# Patient Record
Sex: Female | Born: 1965 | Race: White | Hispanic: No | Marital: Married | Smoking: Never smoker
Health system: Southern US, Community
[De-identification: ages and names within clinical notes are randomized; demographics above are authoritative.]

## PROBLEM LIST (undated history)

## (undated) DIAGNOSIS — K3189 Other diseases of stomach and duodenum: Secondary | ICD-10-CM

## (undated) DIAGNOSIS — K219 Gastro-esophageal reflux disease without esophagitis: Secondary | ICD-10-CM

## (undated) DIAGNOSIS — M1990 Unspecified osteoarthritis, unspecified site: Secondary | ICD-10-CM

## (undated) DIAGNOSIS — C50919 Malignant neoplasm of unspecified site of unspecified female breast: Secondary | ICD-10-CM

## (undated) DIAGNOSIS — K648 Other hemorrhoids: Secondary | ICD-10-CM

## (undated) DIAGNOSIS — K635 Polyp of colon: Secondary | ICD-10-CM

## (undated) DIAGNOSIS — E785 Hyperlipidemia, unspecified: Secondary | ICD-10-CM

## (undated) DIAGNOSIS — T7840XA Allergy, unspecified, initial encounter: Secondary | ICD-10-CM

## (undated) HISTORY — DX: Hyperlipidemia, unspecified: E78.5

## (undated) HISTORY — DX: Allergy, unspecified, initial encounter: T78.40XA

## (undated) HISTORY — DX: Polyp of colon: K63.5

## (undated) HISTORY — PX: COLONOSCOPY: SHX174

## (undated) HISTORY — PX: BREAST LUMPECTOMY: SHX2

## (undated) HISTORY — PX: RECTAL POLYPECTOMY: SHX2309

## (undated) HISTORY — PX: ABDOMINAL HYSTERECTOMY: SHX81

## (undated) HISTORY — DX: Other hemorrhoids: K64.8

## (undated) HISTORY — DX: Gastro-esophageal reflux disease without esophagitis: K21.9

## (undated) HISTORY — PX: OTHER SURGICAL HISTORY: SHX169

---

## 2012-03-15 ENCOUNTER — Emergency Department (HOSPITAL_COMMUNITY)
Admission: EM | Admit: 2012-03-15 | Discharge: 2012-03-15 | Disposition: A | Discharge status: Home / Self Care | Payer: BC Managed Care – PPO | Attending: Emergency Medicine | Admitting: Emergency Medicine

## 2012-03-15 ENCOUNTER — Encounter (HOSPITAL_COMMUNITY): Payer: Self-pay

## 2012-03-15 DIAGNOSIS — R55 Syncope and collapse: Secondary | ICD-10-CM | POA: Insufficient documentation

## 2012-03-15 DIAGNOSIS — N390 Urinary tract infection, site not specified: Secondary | ICD-10-CM | POA: Insufficient documentation

## 2012-03-15 HISTORY — DX: Malignant neoplasm of unspecified site of unspecified female breast: C50.919

## 2012-03-15 HISTORY — DX: Unspecified osteoarthritis, unspecified site: M19.90

## 2012-03-15 HISTORY — DX: Other diseases of stomach and duodenum: K31.89

## 2012-03-15 LAB — URINALYSIS, ROUTINE W REFLEX MICROSCOPIC
Bilirubin Urine: NEGATIVE
Glucose, UA: NEGATIVE mg/dL
Hgb urine dipstick: NEGATIVE
Ketones, ur: NEGATIVE mg/dL
Nitrite: NEGATIVE
Specific Gravity, Urine: 1.025 (ref 1.005–1.030)
pH: 6 (ref 5.0–8.0)

## 2012-03-15 LAB — BASIC METABOLIC PANEL
BUN: 18 mg/dL (ref 6–23)
CO2: 26 mEq/L (ref 19–32)
Chloride: 99 mEq/L (ref 96–112)
Creatinine, Ser: 0.61 mg/dL (ref 0.50–1.10)
GFR calc Af Amer: >90 mL/min (ref >90)
Glucose, Bld: 105 mg/dL — ABNORMAL HIGH (ref 70–99)
Potassium: 3.7 mEq/L (ref 3.5–5.1)

## 2012-03-15 LAB — CBC WITH DIFFERENTIAL/PLATELET
Basophils Relative: 0 % (ref 0–1)
HCT: 37.9 % (ref 36.0–46.0)
Hemoglobin: 12.7 g/dL (ref 12.0–15.0)
Lymphs Abs: 1.3 10*3/uL (ref 0.7–4.0)
MCH: 30.5 pg (ref 26.0–34.0)
MCHC: 33.5 g/dL (ref 30.0–36.0)
Monocytes Absolute: 0.5 10*3/uL (ref 0.1–1.0)
Monocytes Relative: 5 % (ref 3–12)
Neutro Abs: 8.3 10*3/uL — ABNORMAL HIGH (ref 1.7–7.7)
Neutrophils Relative %: 82 % — ABNORMAL HIGH (ref 43–77)
RBC: 4.16 MIL/uL (ref 3.87–5.11)

## 2012-03-15 LAB — URINE MICROSCOPIC-ADD ON

## 2012-03-15 LAB — TROPONIN I: Troponin I: <0.3 ng/mL (ref <0.30)

## 2012-03-15 MED ORDER — ONDANSETRON HCL 4 MG/2ML IJ SOLN
4.0000 mg | Freq: Once | INTRAMUSCULAR | Status: AC
  Administered 2012-03-15: 4 mg via INTRAVENOUS
  Filled 2012-03-15: qty 2

## 2012-03-15 MED ORDER — SODIUM CHLORIDE 0.9 % IV BOLUS (SEPSIS)
1000.0000 mL | Freq: Once | INTRAVENOUS | Status: AC
  Administered 2012-03-15: 1000 mL via INTRAVENOUS

## 2012-03-15 MED ORDER — DEXTROSE 5 % IV SOLN
1.0000 g | Freq: Once | INTRAVENOUS | Status: AC
  Administered 2012-03-15: 1 g via INTRAVENOUS
  Filled 2012-03-15: qty 10

## 2012-03-15 MED ORDER — HEPARIN SOD (PORK) LOCK FLUSH 100 UNIT/ML IV SOLN
INTRAVENOUS | Status: AC
  Administered 2012-03-15: 500 [IU]
  Filled 2012-03-15: qty 5

## 2012-03-15 MED ORDER — NITROFURANTOIN MONOHYD MACRO 100 MG PO CAPS: 100.0000 mg | ORAL_CAPSULE | Freq: Two times a day (BID) | ORAL | Status: DC

## 2012-03-15 NOTE — ED Notes (Signed)
Implanted port in left chest accessed using sterile technique. Blood obtained and NS infusing without difficulty on IV pump. Patient tolerated procedure well.

## 2012-03-15 NOTE — ED Notes (Signed)
Pt was eating lunch today, while sitting at table had what she describes "feeling of getting versed", "went completely out", assisted to floor by family member, thinks was out for approx 15 seconds, was in cont. Of urine and stool. Pt able to recall all information. Has vomited x2 since incident.

## 2012-03-15 NOTE — ED Provider Notes (Signed)
History  This chart was scribed for Carolyn Hutching, MD by Erskine Emery. This patient was seen in room APA07/APA07 and the patient's care was started at 15:53.   CSN: [id number]  Arrival date & time 03/15/12  1545   First MD Initiated Contact with Patient 03/15/12 1553      Chief Complaint  Patient presents with  . Loss of Consciousness    (Consider location/radiation/quality/duration/timing/severity/associated sxs/prior treatment) The history is provided by the patient, the spouse and a friend. No language interpreter was used.  Carolyn Porter is a 46 y.o. female brought in by ambulance, who presents to the Emergency Department complaining of sudden onset syncope (15 seconds) with associated bowel and bladder incontinence and emesis this afternoon. Pt reports she went to church this morning then went to lunch, ate a sandwich and drank some tea, then just blacked out with a feeling described as "like being put under anesthesia." Pt's friend reports her blood sugar was 160 and her blood pressure was 110/68 just after the incident. Pt states she feels fine now other than a little fatigue and she denies any chest pain, SOB, or any other pains. Pt has no current health problems but she was diagnosed with breast cancer in 2001 and now is in remission after surgery and chemo therapy. Pt also reports a h/o migraines (mostly during the chemo) that she hasn't had in a while until this week, and some arthritis. Pt had a recent colonoscopy and rectal polyp removal after issues with anal bleeding. Pt reports she sometimes still sees streaks of blood upon excretion but never has black stools. Pt also has gastric irritation for which she takes Prilosec. Pt had episodes of syncope when she was a teenager but not since. 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   Past Medical History  Diagnosis Date  . Breast cancer   . Gastric irritation   . Arthritis     Past Surgical History  Procedure Date  . Rectal  polypectomy   . Colonoscopy   . Abdominal hysterectomy     No family history on file.  History  Substance Use Topics  . Smoking status: Never Smoker   . Smokeless tobacco: Not on file  . Alcohol Use: Yes     occ    OB History    Grav Para Term Preterm Abortions TAB SAB Ect Mult Living                  Review of Systems A complete 10 system review of systems was obtained and all systems are negative except as noted in the HPI and PMH.    Allergies  Erythromycin  Home Medications   Current Outpatient Rx  Name Route Sig Dispense Refill  . CALCIUM CARBONATE-VITAMIN D 600-200 MG-UNIT PO TABS Oral Take 1 tablet by mouth daily.    Marland Kitchen ONE-DAILY MULTI VITAMINS PO TABS Oral Take 1 tablet by mouth daily.    Marland Kitchen OMEPRAZOLE 20 MG PO CPDR Oral Take 20 mg by mouth daily.      Triage Vitals: BP 109/57  Pulse 76  Temp 97.6 F (36.4 C) (Oral)  Resp 20  Ht 5\' 10"  (1.778 m)  Wt 150 lb (68.04 kg)  BMI 21.52 kg/m2  SpO2 100%  Physical Exam  Nursing note and vitals reviewed. Constitutional: She is oriented to person, place, and time. She appears well-developed and well-nourished.  HENT:  Head: Normocephalic and atraumatic.  Eyes: Conjunctivae normal and EOM are  normal. Pupils are equal, round, and reactive to light.  Neck: Normal range of motion. Neck supple.  Cardiovascular: Normal rate, regular rhythm and normal heart sounds.   Pulmonary/Chest: Effort normal and breath sounds normal.  Abdominal: Soft. Bowel sounds are normal.  Musculoskeletal: Normal range of motion.  Neurological: She is alert and oriented to person, place, and time.  Skin: Skin is warm and dry.  Psychiatric: She has a normal mood and affect.    ED Course  Procedures (including critical care time) DIAGNOSTIC STUDIES: Oxygen Saturation is 100% on room air, normal by my interpretation.    COORDINATION OF CARE: 16:26--I evaluated the patient and we discussed a treatment plan including blood work, EKG, and  IV fluids to which the pt agreed. I told the pt that we will run some tests but there is a good chance we will not be able to figure out why she passed out. I informed the pt that the most common reasons for syncope are sudden blood pressure changes or arrythmia and she doesn't show signs of this. I encouraged the pt to follow up with a cardiologist.   Labs Reviewed - No data to display No results found.   No diagnosis found.    Date: 03/15/2012  Rate: 67  Rhythm: normal sinus rhythm  QRS Axis: normal  Intervals: normal  ST/T Wave abnormalities: nonspecific ST changes  Conduction Disutrbances:none  Narrative Interpretation:   Old EKG Reviewed: none available Early repol   MDM  Patient looks well. Blood pressure slightly low. EKG normal. Hemoglobin normal. Urinalysis shows evidence of infection. Patient feels much better after 1 L of fluids. IV Rocephin given.   discharge home on Macrobid      I personally performed the services described in this documentation, which was scribed in my presence. The recorded information has been reviewed and considered.    Carolyn Hutching, MD 03/15/12 [phone]

## 2012-03-17 LAB — URINE CULTURE

## 2014-11-24 ENCOUNTER — Encounter: Payer: Self-pay | Admitting: Internal Medicine

## 2015-01-25 ENCOUNTER — Ambulatory Visit (INDEPENDENT_AMBULATORY_CARE_PROVIDER_SITE_OTHER): Payer: BLUE CROSS/BLUE SHIELD | Admitting: Internal Medicine

## 2015-01-25 ENCOUNTER — Other Ambulatory Visit (INDEPENDENT_AMBULATORY_CARE_PROVIDER_SITE_OTHER): Payer: BLUE CROSS/BLUE SHIELD

## 2015-01-25 ENCOUNTER — Encounter: Payer: Self-pay | Admitting: Internal Medicine

## 2015-01-25 VITALS — BP 110/68 | HR 68 | Ht 70.0 in | Wt 177.4 lb

## 2015-01-25 DIAGNOSIS — R1011 Right upper quadrant pain: Secondary | ICD-10-CM

## 2015-01-25 DIAGNOSIS — R197 Diarrhea, unspecified: Secondary | ICD-10-CM

## 2015-01-25 DIAGNOSIS — K219 Gastro-esophageal reflux disease without esophagitis: Secondary | ICD-10-CM | POA: Diagnosis not present

## 2015-01-25 LAB — IGA: IGA: 155 mg/dL (ref 68–378)

## 2015-01-25 MED ORDER — NA SULFATE-K SULFATE-MG SULF 17.5-3.13-1.6 GM/177ML PO SOLN: 1.0000 | Freq: Once | ORAL | Status: DC

## 2015-01-25 NOTE — Patient Instructions (Signed)
Your physician has requested that you go to the basement for the following lab work before leaving today:  TTG, IGA   You have been scheduled for a colonoscopy. Please follow written instructions given to you at your visit today.  Please pick up your prep supplies at the pharmacy within the next 1-3 days. If you use inhalers (even only as needed), please bring them with you on the day of your procedure.   You have been scheduled for an abdominal ultrasound at [facility] radiology on 02/03/2015 at 10:00am. Please arrive 15 minutes prior to your appointment for registration. Make certain not to have anything to eat or drink after midnight. Should you need to reschedule your appointment, please contact radiology at [phone]. This test typically takes about 30 minutes to perform.   As discussed with Dr. Henrene Pastor you can increase your Imodium and use it more regularly

## 2015-01-25 NOTE — Progress Notes (Signed)
HISTORY OF PRESENT ILLNESS:  Carolyn Porter is a 49 y.o. female who refers herself today upon recommendation from Carolyn Porter to establish as a new patient upon the retirement of her previous GI physician. Her chief complaint today is chronic diarrhea, abdominal discomfort, and GERD. First, patient reports having had problems with loose stools since 2001 after being treated with chemotherapy for breast cancer. The problem has worsened over the past year. She describes frequent watery stools with urgency that is explosive at times. She reports 5-15 bowel movements per day. The problem is exacerbated by meals. No nocturnal symptoms for the most part. More than half the time she will experience cramping abdominal pain which is relieved with defecation. There is minor intermittent bleeding. She is taking no nonprescription medications and does not use sugar substitutes. She has tried yogurt and probiotics without improvement. She is reluctant to use Imodium, but this helps. She has had no other therapies. She did undergo complete colonoscopy in [place] with Dr. Algis Porter 01/20/2012. A 1 cm proximal rectal polyp was removed and found to be inflammatory. No random colon biopsies. Upper endoscopy that same day was unremarkable. Normal duodenal biopsies. Mild gastritis. Patient has had GERD for about 4 years and has been on PPI with control of symptoms. No dysphagia. She is a Equities trader with experience in endoscopy. Her next complaint is that of right upper quadrant pain she has had for approximately one year. It occurs several times per week and lasts approximately 30 minutes. Exacerbated by large meals. No obvious relieving factors.  REVIEW OF SYSTEMS:  All non-GI ROS negative except for sinus and allergy trouble, back pain  Past Medical History  Diagnosis Date  . Breast cancer   . Gastric irritation   . Arthritis   . Internal hemorrhoid   . Colon polyp     Past Surgical History  Procedure  Laterality Date  . Rectal polypectomy    . Colonoscopy    . Abdominal hysterectomy    . Breast lumpectomy      Social History Carolyn Porter  reports that she has never smoked. She has never used smokeless tobacco. She reports that she drinks alcohol. She reports that she does not use illicit drugs.  family history is not on file.  Allergies  Allergen Reactions  . Erythromycin Anaphylaxis    Patient allergic to all of the mycin medications       PHYSICAL EXAMINATION: Vital signs: BP 110/68 mmHg  Pulse 68  Ht 5\' 10"  (1.778 m)  Wt 177 lb 6.4 oz (80.468 kg)  BMI 25.45 kg/m2  Constitutional: generally well-appearing, no acute distress Psychiatric: alert and oriented x3, cooperative Eyes: extraocular movements intact, anicteric, conjunctiva pink Mouth: oral pharynx moist, no lesions Neck: supple no lymphadenopathy Cardiovascular: heart regular rate and rhythm, no murmur Lungs: clear to auscultation bilaterally Abdomen: soft, nontender, nondistended, no obvious ascites, no peritoneal signs, normal bowel sounds, no organomegaly Rectal: Deferred until colonoscopy Extremities: no clubbing cyanosis or lower extremity edema bilaterally Skin: no lesions on visible extremities Neuro: No focal deficits. No asterixis.   ASSESSMENT:  #1. Chronic problems with loose stools, cramping, and urgency as described. Worse over the past year. Suspect irritable bowel syndrome. Rule out celiac sprue. Rule out microscopic colitis #2. Right upper quadrant pain of 1 year's duration. Rule out gallstones. Rule out musculoskeletal #3. Chronic GERD. Symptoms controlled with PPI at present #4. Remote history of breast cancer  PLAN:  #1. Celiac serologies #2. Colonoscopy with biopsies  to rule out microscopic colitis.The nature of the procedure, as well as the risks, benefits, and alternatives were carefully and thoroughly reviewed with the patient. Ample time for discussion and questions allowed. The  patient understood, was satisfied, and agreed to proceed. #3. Okay to increased frequency of Imodium as this helps #4. Abdominal ultrasound to evaluate right upper quadrant pain #5. Reflux precautions #6. Continue PPI. Refill as requested #7. Follow-up office visit after the above completed

## 2015-01-26 LAB — TISSUE TRANSGLUTAMINASE, IGA: Tissue Transglutaminase Ab, IgA: 1 U/mL (ref <4)

## 2015-02-03 ENCOUNTER — Ambulatory Visit (HOSPITAL_COMMUNITY)
Admission: RE | Admit: 2015-02-03 | Discharge: 2015-02-03 | Disposition: A | Discharge status: Home / Self Care | Payer: BLUE CROSS/BLUE SHIELD | Source: Ambulatory Visit | Attending: Internal Medicine | Admitting: Internal Medicine

## 2015-02-03 ENCOUNTER — Other Ambulatory Visit: Payer: Self-pay | Admitting: Internal Medicine

## 2015-02-03 DIAGNOSIS — R1011 Right upper quadrant pain: Secondary | ICD-10-CM | POA: Diagnosis not present

## 2015-02-03 DIAGNOSIS — K760 Fatty (change of) liver, not elsewhere classified: Secondary | ICD-10-CM | POA: Diagnosis not present

## 2015-02-03 DIAGNOSIS — R197 Diarrhea, unspecified: Secondary | ICD-10-CM

## 2015-02-03 DIAGNOSIS — K828 Other specified diseases of gallbladder: Secondary | ICD-10-CM | POA: Diagnosis not present

## 2015-03-31 ENCOUNTER — Encounter: Payer: Self-pay | Admitting: Internal Medicine

## 2015-03-31 ENCOUNTER — Ambulatory Visit (AMBULATORY_SURGERY_CENTER): Payer: BLUE CROSS/BLUE SHIELD | Admitting: Internal Medicine

## 2015-03-31 VITALS — BP 127/75 | HR 73 | Temp 98.7°F | Resp 21 | Ht 70.0 in | Wt 177.0 lb

## 2015-03-31 DIAGNOSIS — D125 Benign neoplasm of sigmoid colon: Secondary | ICD-10-CM | POA: Diagnosis present

## 2015-03-31 DIAGNOSIS — Z1211 Encounter for screening for malignant neoplasm of colon: Secondary | ICD-10-CM

## 2015-03-31 DIAGNOSIS — K5140 Inflammatory polyps of colon without complications: Secondary | ICD-10-CM | POA: Diagnosis not present

## 2015-03-31 DIAGNOSIS — R197 Diarrhea, unspecified: Secondary | ICD-10-CM | POA: Diagnosis not present

## 2015-03-31 MED ORDER — SODIUM CHLORIDE 0.9 % IV SOLN: 500.0000 mL | INTRAVENOUS | Status: DC

## 2015-03-31 MED ORDER — DEXLANSOPRAZOLE 60 MG PO CPDR: 60.0000 mg | DELAYED_RELEASE_CAPSULE | Freq: Every day | ORAL | Status: DC

## 2015-03-31 NOTE — Progress Notes (Signed)
Pt passed very little air while in recovery, pt c/o cramping, changed position and lowered head of bed, pt still unable to get rid of air, pt went to restroom and was able to pass a little more but still c/o some cramping, gave pt levsin 0.25mg  and once pt was dressed walked around recovery.pt returned to restroom and states she was able to pass more gas in there and feels better discomfort is 3/10 now. Pt states she will walk when she gets home and verbalizes complications to look for, pt discharged home.

## 2015-03-31 NOTE — Op Note (Signed)
[facility]  [address] [place], [id number]   COLONOSCOPY PROCEDURE REPORT  PATIENT: Carolyn Porter, Carolyn Porter  MR#: [id number] BIRTHDATE: 06-29-1965 , 49  yrs. old GENDER: female ENDOSCOPIST: Eustace Quail, MD REFERRED BY:.  Self / Office PROCEDURE DATE:  03/31/2015 PROCEDURE:   Colonoscopy, diagnostic, Colonoscopy with biopsy, and Colonoscopy with snare polypectomy X1 First Screening Colonoscopy - Avg.  risk and is 50 yrs.  old or older - No.  Prior Negative Screening - Now for repeat screening. N/A  History of Adenoma - Now for follow-up colonoscopy & has been > or = to 3 yrs.  N/A  Polyps removed today? Yes ASA CLASS:   Class II INDICATIONS:Clinically significant diarrhea of unexplained origin.. Prior colonoscopy in 2013 with inflammatory rectal polyp. Otherwise negative MEDICATIONS: Monitored anesthesia care and Propofol 300 mg IV  DESCRIPTION OF PROCEDURE:   After the risks benefits and alternatives of the procedure were thoroughly explained, informed consent was obtained.  The digital rectal exam revealed no abnormalities of the rectum.   The LB [id number]  endoscope was introduced through the anus and advanced to the cecum, which was identified by both the appendix and ileocecal valve. No adverse events experienced.   The quality of the prep was excellent. (Suprep was used)  The instrument was then slowly withdrawn as the colon was fully examined. Estimated blood loss is zero unless otherwise noted in this procedure report.   COLON FINDINGS: A single polyp measuring 6 mm in size was found in the sigmoid colon.  A polypectomy was performed with a cold snare. The resection was complete, the polyp tissue was completely retrieved and sent to histology.   The examination was otherwise normal. Random colon biopsies were obtained to rule out microscopic colitis.  Retroflexed views revealed internal hemorrhoids. The time to cecum = 5.4 Withdrawal time = 12.0    The scope was withdrawn and the procedure completed. COMPLICATIONS: There were no immediate complications.  ENDOSCOPIC IMPRESSION: 1.   Single polyp was found in the sigmoid colon; polypectomy was performed with a cold snare 2.   The examination was otherwise normal  RECOMMENDATIONS: 1.  Await biopsy results. Dr. Henrene Pastor will send you a letter with the results and any further recommendations 2. Okay to use Imodium for diarrhea 3.  Repeat colonoscopy in 5 years if polyp adenomatous; otherwise 10 years  eSigned:  Eustace Quail, MD 03/31/2015 2:20 PM   cc: The Patient

## 2015-03-31 NOTE — Progress Notes (Signed)
Called to room to assist during endoscopic procedure.  Patient ID and intended procedure confirmed with present staff. Received instructions for my participation in the procedure from the performing physician.  

## 2015-03-31 NOTE — Patient Instructions (Signed)
YOU HAD AN ENDOSCOPIC PROCEDURE TODAY AT [facility]:   Refer to the procedure report that was given to you for any specific questions about what was found during the examination.  If the procedure report does not answer your questions, please call your gastroenterologist to clarify.  If you requested that your care partner not be given the details of your procedure findings, then the procedure report has been included in a sealed envelope for you to review at your convenience later.  YOU SHOULD EXPECT: Some feelings of bloating in the abdomen. Passage of more gas than usual.  Walking can help get rid of the air that was put into your GI tract during the procedure and reduce the bloating. If you had a lower endoscopy (such as a colonoscopy or flexible sigmoidoscopy) you may notice spotting of blood in your stool or on the toilet paper. If you underwent a bowel prep for your procedure, you may not have a normal bowel movement for a few days.  Please Note:  You might notice some irritation and congestion in your nose or some drainage.  This is from the oxygen used during your procedure.  There is no need for concern and it should clear up in a day or so.  SYMPTOMS TO REPORT IMMEDIATELY:   Following lower endoscopy (colonoscopy or flexible sigmoidoscopy):  Excessive amounts of blood in the stool  Significant tenderness or worsening of abdominal pains  Swelling of the abdomen that is new, acute  Fever of 100F or higher   For urgent or emergent issues, a gastroenterologist can be reached at any hour by calling [phone].   DIET: Your first meal following the procedure should be a small meal and then it is ok to progress to your normal diet. Heavy or fried foods are harder to digest and may make you feel nauseous or bloated.  Likewise, meals heavy in dairy and vegetables can increase bloating.  Drink plenty of fluids but you should avoid alcoholic beverages for 24  hours.  ACTIVITY:  You should plan to take it easy for the rest of today and you should NOT DRIVE or use heavy machinery until tomorrow (because of the sedation medicines used during the test).    FOLLOW UP: Our staff will call the number listed on your records the next business day following your procedure to check on you and address any questions or concerns that you may have regarding the information given to you following your procedure. If we do not reach you, we will leave a message.  However, if you are feeling well and you are not experiencing any problems, there is no need to return our call.  We will assume that you have returned to your regular daily activities without incident.  If any biopsies were taken you will be contacted by phone or by letter within the next 1-3 weeks.  Please call us at [phone] if you have not heard about the biopsies in 3 weeks.    SIGNATURES/CONFIDENTIALITY: You and/or your care partner have signed paperwork which will be entered into your electronic medical record.  These signatures attest to the fact that that the information above on your After Visit Summary has been reviewed and is understood.  Full responsibility of the confidentiality of this discharge information lies with you and/or your care-partner.  Polyp-handout given  Repeat colonoscopy will be determined by pathology.  Wait biopsy results.

## 2015-03-31 NOTE — Progress Notes (Signed)
Report to PACU, RN, vss, BBS= Clear.  

## 2015-04-03 ENCOUNTER — Telehealth: Payer: Self-pay | Admitting: *Deleted

## 2015-04-03 NOTE — Telephone Encounter (Signed)
  Follow up Call-  Call back number 03/31/2015  Post procedure Call Back phone  # [phone]  Permission to leave phone message Yes     Left message on answering machine to call back in experiencing any problems or if she has any questions

## 2015-04-04 ENCOUNTER — Encounter: Payer: Self-pay | Admitting: Internal Medicine

## 2015-04-04 ENCOUNTER — Other Ambulatory Visit: Payer: Self-pay

## 2015-04-04 MED ORDER — BUDESONIDE 3 MG PO CPEP: 9.0000 mg | ORAL_CAPSULE | Freq: Every day | ORAL | Status: DC

## 2015-04-11 ENCOUNTER — Telehealth: Payer: Self-pay

## 2015-04-11 DIAGNOSIS — D62 Acute posthemorrhagic anemia: Secondary | ICD-10-CM

## 2015-04-11 NOTE — Telephone Encounter (Signed)
Submitted prior authorization for Dexilant to Cover My Meds.  Awaiting response.

## 2015-04-20 ENCOUNTER — Telehealth: Payer: Self-pay

## 2015-04-20 NOTE — Telephone Encounter (Signed)
Cover my meds prior authorization for Dexilant inexplicably cancelled.  Spoke to pharmacist who tried to run it again and got the message must first try Pantoprazole or Omeprazole.  Pantoprazole is the most comparable so left message asking if patient wanted me to go ahead and send that to see if it worked.  If it doesn't, we will then be in a position to resubmit the request for Dexilant

## 2015-05-04 NOTE — Telephone Encounter (Signed)
Left message with patient about switching to another PPI

## 2015-05-11 ENCOUNTER — Ambulatory Visit: Payer: BLUE CROSS/BLUE SHIELD | Admitting: Internal Medicine

## 2015-05-22 ENCOUNTER — Ambulatory Visit (INDEPENDENT_AMBULATORY_CARE_PROVIDER_SITE_OTHER): Payer: BLUE CROSS/BLUE SHIELD | Admitting: Internal Medicine

## 2015-05-22 ENCOUNTER — Encounter: Payer: Self-pay | Admitting: Internal Medicine

## 2015-05-22 VITALS — BP 112/78 | HR 80 | Ht 70.0 in | Wt 179.4 lb

## 2015-05-22 DIAGNOSIS — R197 Diarrhea, unspecified: Secondary | ICD-10-CM

## 2015-05-22 DIAGNOSIS — K219 Gastro-esophageal reflux disease without esophagitis: Secondary | ICD-10-CM

## 2015-05-22 DIAGNOSIS — K52832 Lymphocytic colitis: Secondary | ICD-10-CM | POA: Diagnosis not present

## 2015-05-22 MED ORDER — BUDESONIDE 3 MG PO CPEP: 9.0000 mg | ORAL_CAPSULE | Freq: Every day | ORAL | Status: DC

## 2015-05-22 NOTE — Patient Instructions (Signed)
We have sent the following medications to your pharmacy for you to pick up at your convenience:  Budesonide  

## 2015-05-22 NOTE — Progress Notes (Signed)
HISTORY OF PRESENT ILLNESS:  Carolyn Porter is a 49 y.o. female who was initially evaluated 01/25/2015 regarding chronic diarrhea, right upper quadrant pain, and chronic GERD. See that dictation for details. Celiac testing was negative. She underwent complete colonoscopy 03/31/2015. She was found to have a non-adenomatous colon polyp. Examination was otherwise normal. However, random colon biopsies revealed lymphocytic colitis. She was prescribed budesonide 9 mg daily and told to follow-up at this time. There was no significant effect from the medication initially. However, after about 2 weeks she did notice improvement in symptoms. Currently she reports that her problems with loose stools are 50% better. There is less frequency and urgency. Stools have bit more formed. She is using Imodium less frequently. No appreciable medication side effects. No new issues. She does have questions with regards to PPI and her GERD. She was taking Nexium 40 mg daily with good control of symptoms. However, insurance formulary does not prefer Nexium.  REVIEW OF SYSTEMS:  All non-GI ROS negative upon comprehensive review  Past Medical History  Diagnosis Date  . Breast cancer ([place])   . Gastric irritation   . Arthritis   . Internal hemorrhoid   . Colon polyp   . Allergy   . GERD (gastroesophageal reflux disease)   . Hyperlipidemia     Past Surgical History  Procedure Laterality Date  . Rectal polypectomy    . Colonoscopy    . Abdominal hysterectomy    . Breast lumpectomy    . Portacath removal      Social History Carolyn Porter  reports that she has never smoked. She has never used smokeless tobacco. She reports that she drinks alcohol. She reports that she does not use illicit drugs.  family history includes Diabetes in her maternal grandmother and mother.  Allergies  Allergen Reactions  . Erythromycin Anaphylaxis    Patient allergic to all of the mycin medications       PHYSICAL  EXAMINATION: Vital signs: BP 112/78 mmHg  Pulse 80  Ht 5\' 10"  (1.778 m)  Wt 179 lb 6.4 oz (81.375 kg)  BMI 25.74 kg/m2 General: Well-developed, well-nourished, no acute distress HEENT: Sclerae are anicteric Abdomen: Not reexamined Psychiatric: alert and oriented x3. Cooperative  ASSESSMENT:  #1. Lymphocytic colitis. 50% improvement on budesonide 9 mg daily (approximate 6 weeks of therapy) #2. GERD. Issues regarding PPI formulary preference  PLAN:  #1. Continue budesonide 9 mg daily #2. Okay to use Imodium as needed #3. Patient will contact the office regarding formulary preference for PPI. We will prescribe accordingly at equivalent dosage  #4. GI follow-up 2 months; contact the office in the interim for questions or problems

## 2015-05-23 ENCOUNTER — Telehealth: Payer: Self-pay | Admitting: Internal Medicine

## 2015-05-24 MED ORDER — PANTOPRAZOLE SODIUM 40 MG PO TBEC: 40.0000 mg | DELAYED_RELEASE_TABLET | Freq: Every day | ORAL | Status: DC

## 2015-05-24 NOTE — Telephone Encounter (Signed)
Sent Pantoprazole to pharmacy.  Called patient and told her to try this for a while and see if it worked as well as Building [employer].  Patient agreed

## 2015-05-26 NOTE — Telephone Encounter (Signed)
Patient trying a trial of Protonix, which is on her formulary, as an alternative to [employer]

## 2015-06-26 ENCOUNTER — Other Ambulatory Visit: Payer: Self-pay | Admitting: Internal Medicine

## 2015-08-04 ENCOUNTER — Encounter: Payer: Self-pay | Admitting: Internal Medicine

## 2015-08-04 ENCOUNTER — Ambulatory Visit (INDEPENDENT_AMBULATORY_CARE_PROVIDER_SITE_OTHER): Payer: BLUE CROSS/BLUE SHIELD | Admitting: Internal Medicine

## 2015-08-04 VITALS — BP 112/72 | HR 120 | Ht 68.75 in | Wt 178.5 lb

## 2015-08-04 DIAGNOSIS — R197 Diarrhea, unspecified: Secondary | ICD-10-CM

## 2015-08-04 DIAGNOSIS — K219 Gastro-esophageal reflux disease without esophagitis: Secondary | ICD-10-CM | POA: Diagnosis not present

## 2015-08-04 DIAGNOSIS — K52832 Lymphocytic colitis: Secondary | ICD-10-CM

## 2015-08-04 MED ORDER — PANTOPRAZOLE SODIUM 40 MG PO TBEC: 40.0000 mg | DELAYED_RELEASE_TABLET | Freq: Every day | ORAL | Status: DC

## 2015-08-04 MED ORDER — BUDESONIDE 3 MG PO CPEP: 9.0000 mg | ORAL_CAPSULE | Freq: Every day | ORAL | Status: DC

## 2015-08-04 NOTE — Progress Notes (Signed)
HISTORY OF PRESENT ILLNESS:  Carolyn Porter is a 50 y.o. female who was initially evaluated 01/25/2015 regarding chronic diarrhea, right upper quadrant pain, and chronic GERD. See that dictation. Celiac testing was negative. Complete colonoscopy on 03/31/2015 revealed non-adenomatous colon polyp was otherwise normal. Random colon biopsies revealed lymphocytic colitis. She was prescribed budesonide 9 mg daily. Last evaluated in the office 05/22/2015. At that time 50% better. We continued her on budesonide 9 mg daily. As well, switching PPI to pantoprazole for insurance formulary reasons. She presents today for follow-up as requested. Since her last visit she reports ongoing improvement in her bowel habits. Describes is greater than 75% improvement. Not requiring any Imodium. Having proximal 45 bowel movements per day with occasional urgency. No appreciable medication side effects. In terms of PPI, has done well after changing to pantoprazole. No reflux symptoms.  REVIEW OF SYSTEMS:  All non-GI ROS negative   Past Medical History  Diagnosis Date  . Breast cancer ([place])   . Gastric irritation   . Arthritis   . Internal hemorrhoid   . Colon polyp   . Allergy   . GERD (gastroesophageal reflux disease)   . Hyperlipidemia     Past Surgical History  Procedure Laterality Date  . Rectal polypectomy    . Colonoscopy    . Abdominal hysterectomy    . Breast lumpectomy    . Portacath removal      Social History Carolyn Porter  reports that she has never smoked. She has never used smokeless tobacco. She reports that she drinks alcohol. She reports that she does not use illicit drugs.  family history includes Diabetes in her maternal grandmother and mother.  Allergies  Allergen Reactions  . Erythromycin Anaphylaxis    Patient allergic to all of the mycin medications       PHYSICAL EXAMINATION: Vital signs: BP 112/72 mmHg  Pulse 120  Ht 5' 8.75" (1.746 m)  Wt 178 lb 8 oz (80.967 kg)  BMI  26.56 kg/m2 General: Well-developed, well-nourished, no acute distress HEENT: Sclerae are anicteric, conjunctiva pink. Oral mucosa intact Lungs: Clear Heart: Regular Abdomen: soft, nontender, nondistended, no obvious ascites, no peritoneal signs, normal bowel sounds. No organomegaly. Extremities: No clubbing cyanosis or edema Psychiatric: alert and oriented x3. Cooperative   ASSESSMENT:  #1. Lymphocytic colitis. Continued improvement on budesonide #2. GERD. Doing well on conversion to pantoprazole.   PLAN:  #1. Continue budesonide 9 mg daily. Not ready to taper at this point #2. Refill GI medications #3. GI office follow-up for reassessment in 3 months

## 2015-08-04 NOTE — Patient Instructions (Signed)
We have sent the following medications to your pharmacy for you to pick up at your convenience:  Budesonide, Protonix  Please follow up with Dr. Henrene Pastor in 3 months

## 2016-08-02 ENCOUNTER — Ambulatory Visit (INDEPENDENT_AMBULATORY_CARE_PROVIDER_SITE_OTHER): Payer: BLUE CROSS/BLUE SHIELD | Admitting: Internal Medicine

## 2016-08-02 ENCOUNTER — Encounter: Payer: Self-pay | Admitting: Internal Medicine

## 2016-08-02 VITALS — BP 124/90 | HR 84 | Ht 68.75 in | Wt 181.5 lb

## 2016-08-02 DIAGNOSIS — K52832 Lymphocytic colitis: Secondary | ICD-10-CM | POA: Diagnosis not present

## 2016-08-02 DIAGNOSIS — K219 Gastro-esophageal reflux disease without esophagitis: Secondary | ICD-10-CM

## 2016-08-02 MED ORDER — RANITIDINE HCL 150 MG PO TABS: 150.0000 mg | ORAL_TABLET | Freq: Every day | ORAL | 6 refills | Status: DC

## 2016-08-02 MED ORDER — BUDESONIDE 3 MG PO CPEP: 9.0000 mg | ORAL_CAPSULE | Freq: Every day | ORAL | 11 refills | Status: DC

## 2016-08-02 NOTE — Progress Notes (Signed)
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 See that dictation. Celiac testing was negative. Complete colonoscopy on 03/31/2015 revealed non-adenomatous colon polyp and was otherwise normal. Random colon biopsies revealed lymphocytic colitis. She was placed on budesonide 9 mg daily and that follow-up 6 weeks later was 50% better. He is continued on medication and at her last visit 08/04/2015 he was 75% better. She describes 4-5 bowel movements per day with less urgency. She was not using Imodium. She was to follow-up in 3 months with presents at this time. She presents for ongoing management of her lymphocytic colitis as well as complaints of worsening GERD despite daily pantoprazole. First, she continues with similar bowel frequency but overall feels that she is better. At one point she decreased her budesonide to 6 mg daily and continues on that dose. 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 No nocturnal symptoms. No incontinence. Next, worsening GERD, particularly at night. She did have upper endoscopy in 2013 which revealed a hiatal hernia and mild erosive esophagitis. This was prior to PPI therapy. No Barrett's. Since her last visit, she has gained about 3 pounds. No dysphagia. No other issues.  REVIEW OF SYSTEMS:  All non-GI ROS negative except for back pain  Past Medical History:  Diagnosis Date  . Allergy   . Arthritis   . Breast cancer ([place]yn Porter)   . Colon polyp   . Gastric irritation   . GERD (gastroesophageal reflux disease)   . Hyperlipidemia   . Internal hemorrhoid     Past Surgical History:  Procedure Laterality Date  . ABDOMINAL HYSTERECTOMY    . BREAST LUMPECTOMY    . COLONOSCOPY    . portacath removal    . RECTAL POLYPECTOMY      Social History Carolyn Porter  reports that  she has never smoked. She has never used smokeless tobacco. She reports that she drinks alcohol. She reports that she does not use drugs.  family history includes Diabetes in her maternal grandmother and mother.  Allergies  Allergen Reactions  . Erythromycin Anaphylaxis    Patient allergic to all of the mycin medications       PHYSICAL EXAMINATION: Vital signs: BP 124/90 (BP Location: Left Arm, Patient Position: Sitting, Cuff Size: Normal)   Pulse 84   Ht 5' 8.75" (1.746 m)   Wt 181 lb 8 oz (82.3 kg)   BMI 27.00 kg/m   Constitutional: generally well-appearing, no acute distress Psychiatric: alert and oriented x3, cooperative Eyes: extraocular movements intact, anicteric, conjunctiva pink Mouth: oral pharynx moist, no lesions Neck: supple no lymphadenopathy Cardiovascular: heart regular rate and rhythm, no murmur Lungs: clear to auscultation bilaterally Abdomen: soft, nontender, nondistended, no obvious ascites, no peritoneal signs, normal bowel sounds, no organomegaly Rectal:Ommitted Extremities: no clubbing cyanosis or lower extremity edema bilaterally Skin: no lesions on visible extremities Neuro: No focal deficits. Cranial nerves intact  ASSESSMENT:  #1. Lymphocytic colitis. Improved on budesonide. Currently on 6 mg. Still with 4-5 bowel movements per day but less urgency #2. GERD. Deteriorated #3. Colonoscopy 2016 as described   PLAN:  #1. Recommend Imodium one daily to see if this helps frequency of bowel movements #2. Decrease budesonide to 3 mg daily. If she remains stable on his dosage, she can stop the medication to see if she has a durable response.  Medication refilled #3. Reflux precautions with attention to weight loss #4. Continue pantoprazole 40 mg daily. Taken morning 30 minutes before breakfast. Refilled #5. Initiate ranitidine 150 mg at night. Patient will obtain OTC. We will: A prescription if she wishes. #6. GI follow-up 3-6 months. Contact the office  in the interim questions or problems

## 2016-08-02 NOTE — Patient Instructions (Signed)
We have sent the following medications to your pharmacy for you to pick up at your convenience:  Budesonide, Zantac  As discussed with Dr. Henrene Pastor:  You make take 1 Imodium daily, adjust  You may decrease your Budesonide as tolerated  Work on weight reduction  Please follow up in 3-6 months

## 2016-08-18 ENCOUNTER — Other Ambulatory Visit: Payer: Self-pay | Admitting: Internal Medicine

## 2016-11-22 ENCOUNTER — Other Ambulatory Visit: Payer: Self-pay | Admitting: Internal Medicine

## 2017-01-28 IMAGING — US US ABDOMEN LIMITED
1 series · 14 of 25 positions shown · non-contrast
Comparison: None in PACs

CLINICAL DATA: Chronic right upper quadrant pain associated with
diarrhea, history of breast malignancy, gastroesophageal reflux.

EXAM:
US ABDOMEN LIMITED - RIGHT UPPER QUADRANT

[Series 1: us abdomen limited · 0.18mm/px · 14 of 45 slices shown]
[im 1/45]
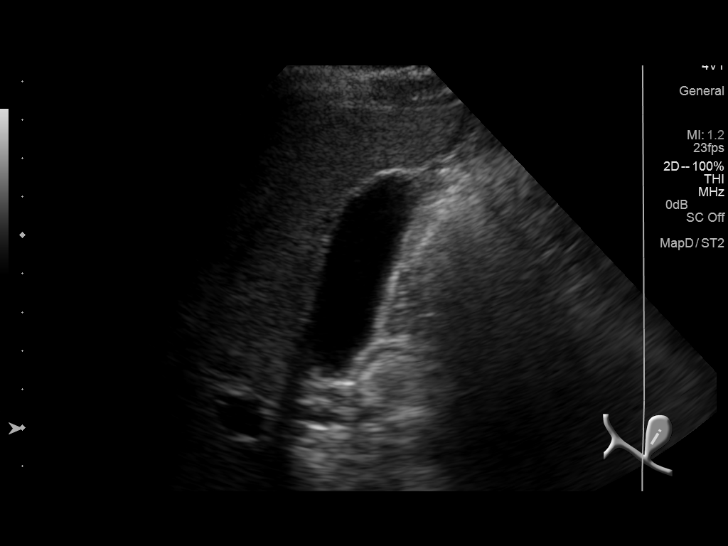
[im 4/45]
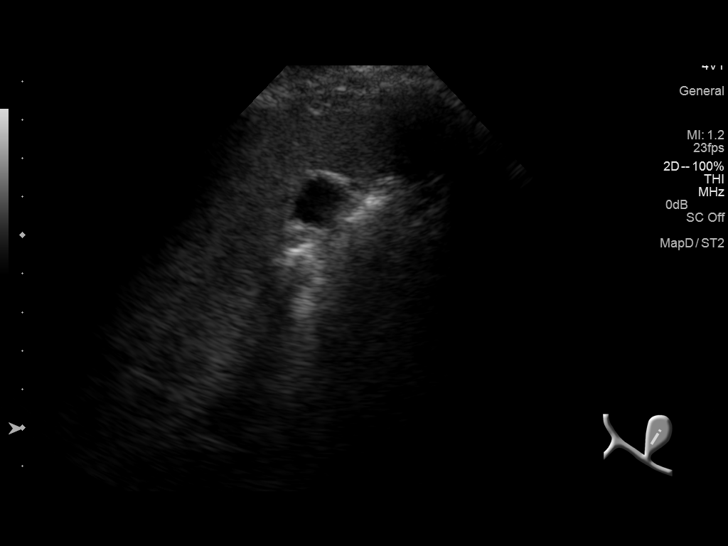
[im 8/45]
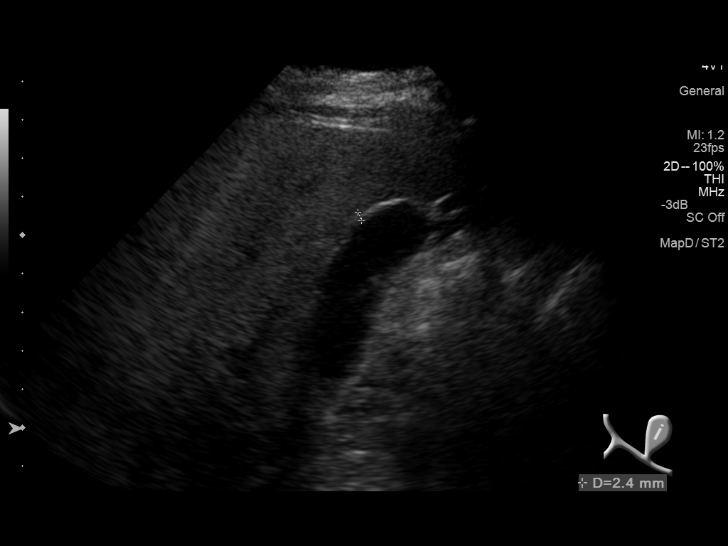
[im 12/45]
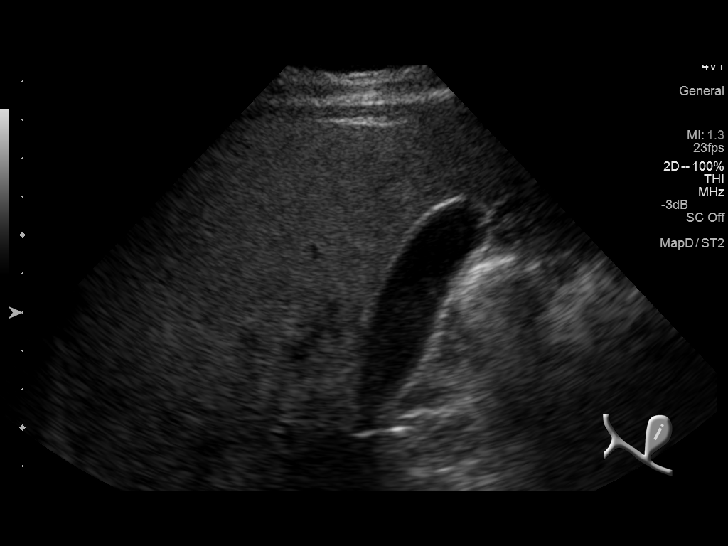
[im 15/45]
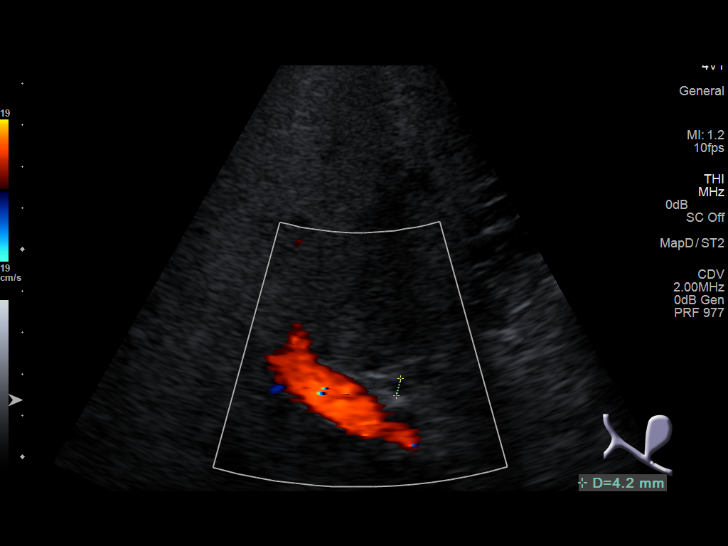
[im 17/45]
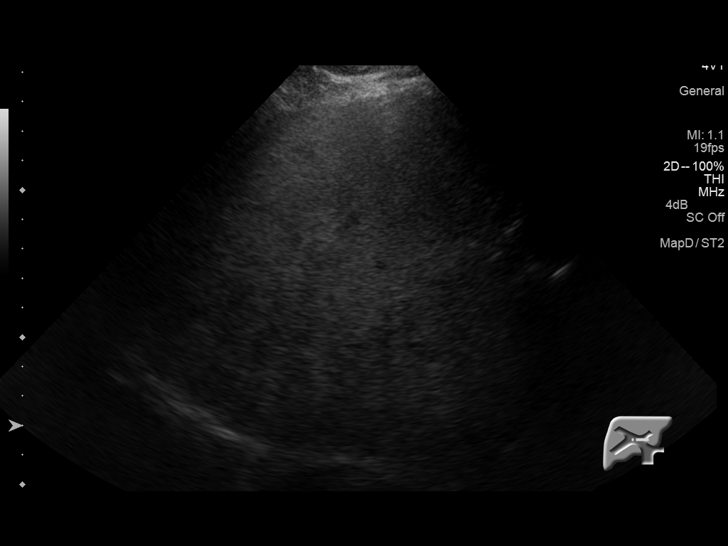
[im 21/45]
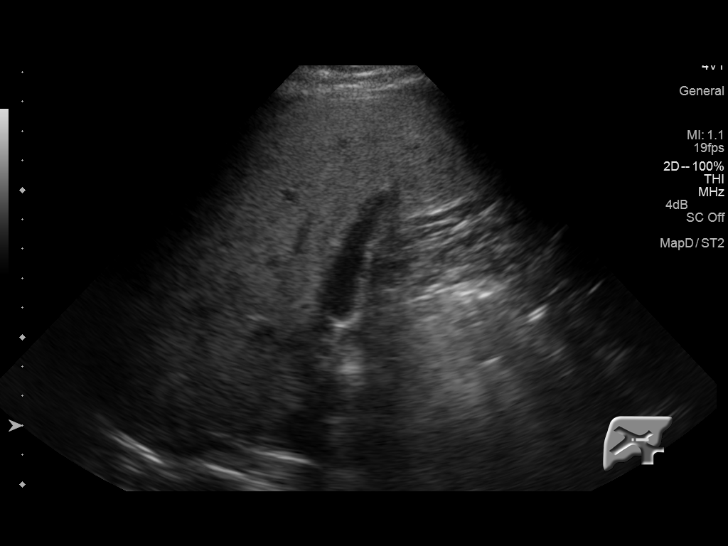
[im 24/45]
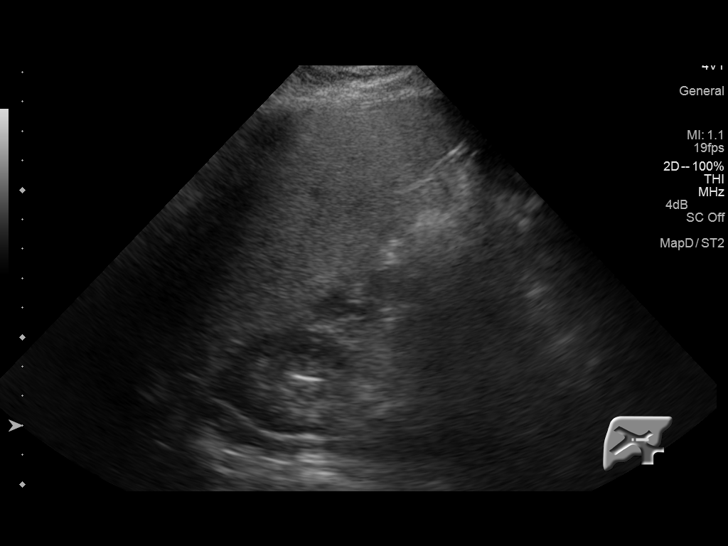
[im 28/45]
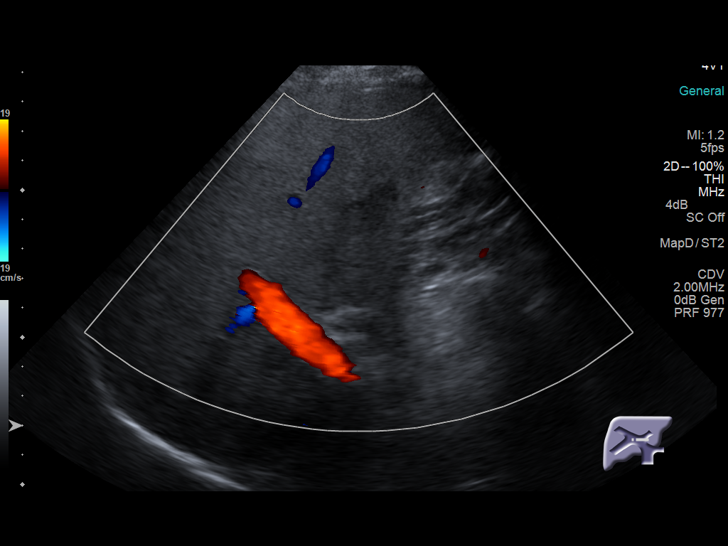
[im 30/45]
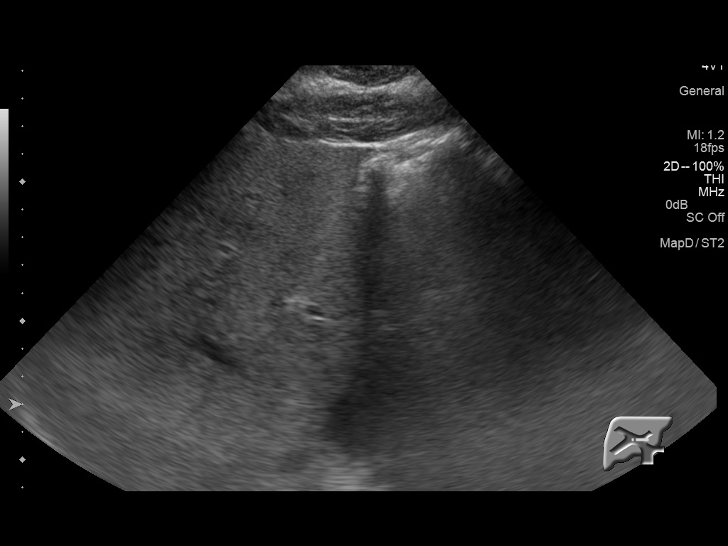
[im 34/45]
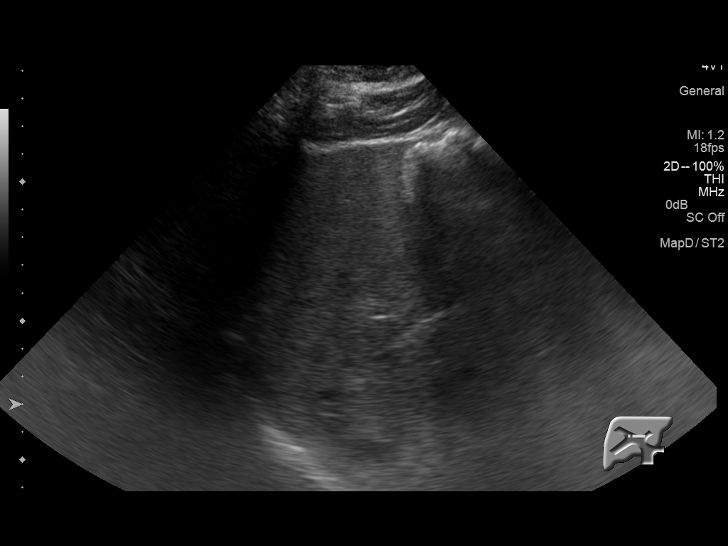
[im 37/45]
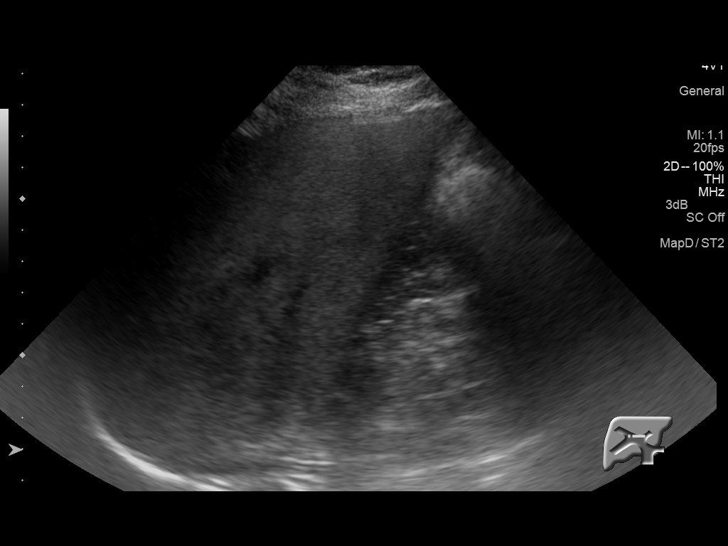
[im 41/45]
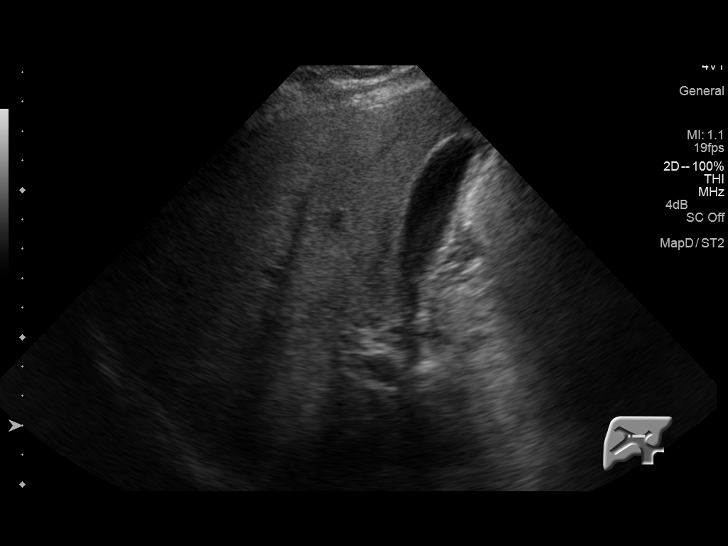
[im 45/45]
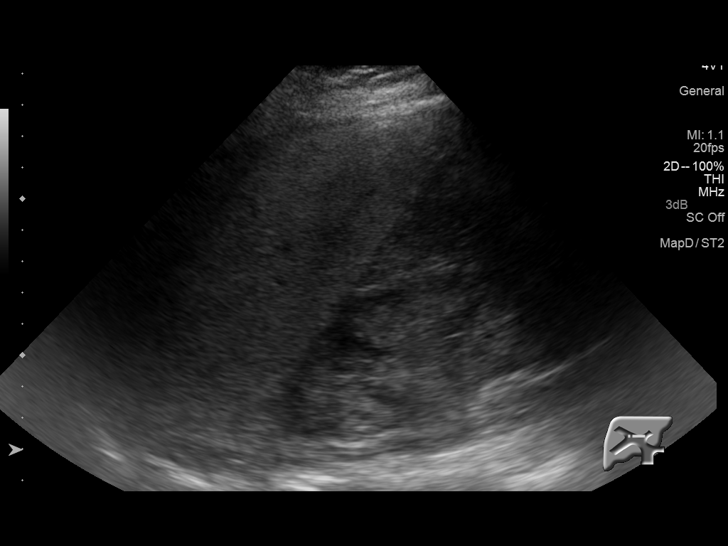

[14 of 25 positions shown; findings below may reference images not displayed]

FINDINGS: Gallbladder:

The gallbladder is adequately distended. There is echogenic bile
versus sludge. There is no gallbladder wall thickening,
pericholecystic fluid, or positive sonographic Murphy's sign.

Common bile duct:

Diameter: 4.2 mm

Liver:

The liver exhibits mildly increased echotexture diffusely. There is
no focal mass nor ductal dilation.
IMPRESSION: 1. Gallbladder sludge. No stones nor sonographic evidence of acute
cholecystitis. If there are clinical concerns of gallbladder
dysfunction, a nuclear medicine hepatobiliary scan may be useful.
2. Fatty infiltrative change of the liver.

## 2017-03-17 ENCOUNTER — Other Ambulatory Visit: Payer: Self-pay | Admitting: Internal Medicine

## 2017-11-02 ENCOUNTER — Other Ambulatory Visit: Payer: Self-pay | Admitting: Internal Medicine

## 2018-01-06 ENCOUNTER — Ambulatory Visit: Payer: BLUE CROSS/BLUE SHIELD | Admitting: Internal Medicine

## 2018-01-06 ENCOUNTER — Encounter: Payer: Self-pay | Admitting: Internal Medicine

## 2018-01-06 VITALS — BP 120/84 | HR 76 | Ht 68.75 in | Wt 175.5 lb

## 2018-01-06 DIAGNOSIS — K219 Gastro-esophageal reflux disease without esophagitis: Secondary | ICD-10-CM

## 2018-01-06 DIAGNOSIS — K52832 Lymphocytic colitis: Secondary | ICD-10-CM | POA: Diagnosis not present

## 2018-01-06 MED ORDER — PANTOPRAZOLE SODIUM 40 MG PO TBEC: DELAYED_RELEASE_TABLET | ORAL | 11 refills | Status: DC

## 2018-01-06 MED ORDER — RANITIDINE HCL 150 MG PO TABS: 150.0000 mg | ORAL_TABLET | Freq: Every day | ORAL | 11 refills | Status: DC

## 2018-01-06 MED ORDER — BUDESONIDE 3 MG PO CPEP: 9.0000 mg | ORAL_CAPSULE | Freq: Every day | ORAL | 11 refills | Status: DC

## 2018-01-06 NOTE — Progress Notes (Signed)
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 Subsequently diagnosed with lymphocytic colitis on colonoscopy and treated with budesonide. Last office evaluation February 2018. See that dictation for details. She was continued on budesonide with a tapering schedule, GERD medications adjusted, use of Imodium encouraged, and follow-up in 3-6 months recommended. She follows up at this time. Patient tells me that she weaned off of budesonide about one year ago. Initially some worsening of loose bowels. Currently with fluctuating bowel habits. May have 4 or 5 loose bowel movements per day or just one bowel movement per day. Using Imodium less. No urgency. No incontinence as previous. Quality of life significantly improved. She wishes to have budesonide on hand in case of a flare. Also refill of GERD medications. No dysphagia. She does well with dietary adherence. No new GI complaints. Last colonoscopy October 2016. Normal except for inflammatory polyp. Follow-up in 10 years recommended. The patient tells me that she was diagnosed withdiabetes mellitus. She did not want to take metformin concerns over the common side effect of diarrhea. She is having reevaluation in the near future.  REVIEW OF SYSTEMS:  All non-GI ROS negative except for seasonal allergies  Past Medical History:  Diagnosis Date  . Allergy   . Arthritis   . Breast cancer ([place])   . Colon polyp   . Gastric irritation   . GERD (gastroesophageal reflux disease)   . Hyperlipidemia   . Internal hemorrhoid     Past Surgical History:  Procedure Laterality Date  . ABDOMINAL HYSTERECTOMY    . BREAST LUMPECTOMY    . COLONOSCOPY    . portacath removal    . RECTAL POLYPECTOMY      Social History Carolyn Porter  reports that she has never smoked. She has never used  smokeless tobacco. She reports that she drinks alcohol. She reports that she does not use drugs.  family history includes Diabetes in her maternal grandmother and mother.  Allergies  Allergen Reactions  . Erythromycin Anaphylaxis    Patient allergic to all of the mycin medications       PHYSICAL EXAMINATION: Vital signs: BP 120/84 (BP Location: Left Arm, Patient Position: Sitting, Cuff Size: Normal)   Pulse 76   Ht 5' 8.75" (1.746 m)   Wt 175 lb 8 oz (79.6 kg)   BMI 26.11 kg/m   Constitutional: generally well-appearing, no acute distress Psychiatric: alert and oriented x3, cooperative Eyes: extraocular movements intact, anicteric, conjunctiva pink Mouth: oral pharynx moist, no lesions Neck: supple no lymphadenopathy Cardiovascular: heart regular rate and rhythm, no murmur Lungs: clear to auscultation bilaterally Abdomen: soft, nontender, nondistended, no obvious ascites, no peritoneal signs, normal bowel sounds, no organomegaly Rectal:omitted Extremities: no clubbing, cyanosis, or lower extremity edema bilaterally Skin: no lesions on visible extremities Neuro: No focal deficits. Cranial nerves intact  ASSESSMENT:  #1. Lymphocytic colitis. Currently off budesonide. Doing well with on demand Imodium #2. GERD. On pantoprazole 40 mg in the morning and ranitidine 150 mg at night as needed #3. Colonoscopy October 2016 as described   PLAN:  #1. Imodium as needed #2. Refill budesonide. We discussed reinstitution and tapering schedule should it be needed #3. Refill pantoprazole and ranitidine #4. Reflux precautions with attention to weight loss #5. Routine colonoscopy around 2026 #6. Routine GI follow-up one year. Contact the office in the interim for any  questions or problems  25 minutes spent face-to-face with the patient. Greater than 50% a time use for counseling regarding her lymphocytic colitis and GERD

## 2018-01-06 NOTE — Patient Instructions (Signed)
We have sent the following medications to your pharmacy for you to pick up at your convenience: Zantac, Protonix, Budesonide  Please follow up in one year

## 2019-01-06 LAB — HEMOGLOBIN A1C: Hemoglobin A1C: 7.9

## 2019-01-06 LAB — TSH: TSH: 1.85 (ref 0.41–5.90)

## 2019-01-06 LAB — VITAMIN D 25 HYDROXY (VIT D DEFICIENCY, FRACTURES): Vit D, 25-Hydroxy: 76

## 2019-01-06 LAB — BASIC METABOLIC PANEL
BUN: 14 (ref 4–21)
Creatinine: 0.7 (ref 0.5–1.1)

## 2019-01-20 ENCOUNTER — Other Ambulatory Visit: Payer: Self-pay | Admitting: Internal Medicine

## 2019-01-20 NOTE — Telephone Encounter (Signed)
Pt would like to speak with you regarding refills for pantoprazole. Pt needs OV before refills per notes in Epic, unfortunately the only appts available with APP are for the week of 8/24 and pt cannot come in that week at all. Pt is also inquiring why she needs an ov to be able to rf meds. She stated that last time she was seen last year she was told that she was not going to have any issues if she needed to rf her meds. Pls call her.

## 2019-01-20 NOTE — Telephone Encounter (Signed)
Refills have been given pt aware she is due for a follow up. However we have no appointments to offer her at the moment. She will call back in a week or so and see if our schedule for Sept has been released

## 2019-02-26 ENCOUNTER — Encounter (INDEPENDENT_AMBULATORY_CARE_PROVIDER_SITE_OTHER): Payer: Self-pay

## 2019-02-26 ENCOUNTER — Other Ambulatory Visit: Payer: Self-pay

## 2019-02-26 ENCOUNTER — Encounter: Payer: Self-pay | Admitting: "Endocrinology

## 2019-02-26 ENCOUNTER — Ambulatory Visit: Payer: BLUE CROSS/BLUE SHIELD | Admitting: "Endocrinology

## 2019-02-26 VITALS — BP 109/76 | HR 98 | Temp 97.8°F | Ht 70.0 in | Wt 179.2 lb

## 2019-02-26 DIAGNOSIS — E782 Mixed hyperlipidemia: Secondary | ICD-10-CM | POA: Diagnosis not present

## 2019-02-26 DIAGNOSIS — E1165 Type 2 diabetes mellitus with hyperglycemia: Secondary | ICD-10-CM | POA: Diagnosis not present

## 2019-02-26 NOTE — Patient Instructions (Signed)

## 2019-02-26 NOTE — Progress Notes (Signed)
Endocrinology Consult Note       02/26/2019, 1:39 PM   Subjective:    Patient ID: Carolyn Porter, female    DOB: 16-Jan-1966.  Mitchell R Kelty is being seen in consultation for management of currently uncontrolled symptomatic diabetes requested by  Su Grand, MD.   Past Medical History:  Diagnosis Date  . Allergy   . Arthritis   . Breast cancer ([place])   . Colon polyp   . Gastric irritation   . GERD (gastroesophageal reflux disease)   . Hyperlipidemia   . Internal hemorrhoid     Past Surgical History:  Procedure Laterality Date  . ABDOMINAL HYSTERECTOMY    . BREAST LUMPECTOMY    . COLONOSCOPY    . portacath removal    . RECTAL POLYPECTOMY      Social History   Socioeconomic History  . Marital status: Married    Spouse name: Not on file  . Number of children: 2  . Years of education: Not on file  . Highest education level: Not on file  Occupational History  . Occupation: Therapist, sports  Social Needs  . Financial resource strain: Not on file  . Food insecurity    Worry: Not on file    Inability: Not on file  . Transportation needs    Medical: Not on file    Non-medical: Not on file  Tobacco Use  . Smoking status: Never Smoker  . Smokeless tobacco: Never Used  Substance and Sexual Activity  . Alcohol use: Yes    Alcohol/week: 0.0 standard drinks    Comment: occ  . Drug use: No  . Sexual activity: Yes    Birth control/protection: Surgical  Lifestyle  . Physical activity    Days per week: Not on file    Minutes per session: Not on file  . Stress: Not on file  Relationships  . Social Herbalist on phone: Not on file    Gets together: Not on file    Attends religious service: Not on file    Active member of club or organization: Not on file    Attends meetings of clubs or organizations: Not on file    Relationship status: Not on file  Other Topics Concern  . Not on file   Social History Narrative  . Not on file    Family History  Problem Relation Age of Onset  . Diabetes Mother   . Diabetes Maternal Grandmother     Outpatient Encounter Medications as of 02/26/2019  Medication Sig  . aspirin EC 81 MG tablet Take 81 mg by mouth daily.  Marland Kitchen co-enzyme Q-10 30 MG capsule Take 30 mg by mouth 3 (three) times daily.  Marland Kitchen loperamide (IMODIUM A-D) 2 MG tablet Take 2 mg by mouth as needed for diarrhea or loose stools.  . Cholecalciferol (VITAMIN D3) 5000 units CAPS Take 1 capsule by mouth daily.  . fluticasone (FLONASE) 50 MCG/ACT nasal spray Place 2 sprays into both nostrils as needed.   . Multiple Vitamin (MULTIVITAMIN) tablet Take 3 tablets by mouth daily.   . pantoprazole (PROTONIX) 40 MG tablet TAKE 1  TABLET BY MOUTH EVERY DAY, NEED OFFICE VISIT FOR FUTHER REFILLS  . rosuvastatin (CRESTOR) 10 MG tablet Take 10 mg by mouth.  . [DISCONTINUED] budesonide (ENTOCORT EC) 3 MG 24 hr capsule Take 3 capsules (9 mg total) by mouth daily. (Patient not taking: Reported on 02/26/2019)  . [DISCONTINUED] fexofenadine (ALLEGRA) 180 MG tablet Take 180 mg by mouth as needed.   . [DISCONTINUED] ranitidine (ZANTAC) 150 MG tablet Take 150 mg by mouth as needed for heartburn.  . [DISCONTINUED] ranitidine (ZANTAC) 150 MG tablet Take 1 tablet (150 mg total) by mouth at bedtime. (Patient not taking: Reported on 02/26/2019)   No facility-administered encounter medications on file as of 02/26/2019.     ALLERGIES: Allergies  Allergen Reactions  . Erythromycin Anaphylaxis    Patient allergic to all of the mycin medications    VACCINATION STATUS:  There is no immunization history on file for this patient.  Diabetes She presents for her initial diabetic visit. She has type 2 diabetes mellitus. Onset time: She was diagnosed at approximate age of 86 years. Her disease course has been stable. There are no hypoglycemic associated symptoms. Pertinent negatives for hypoglycemia include no  confusion, headaches, pallor or seizures. There are no diabetic associated symptoms. Pertinent negatives for diabetes include no chest pain, no polydipsia, no polyphagia and no polyuria. There are no hypoglycemic complications. Symptoms are stable. There are no diabetic complications. Risk factors for coronary artery disease include diabetes mellitus and dyslipidemia. When asked about current treatments, none were reported. Her weight is fluctuating minimally. She is following a generally unhealthy diet. When asked about meal planning, she reported none. She has not had a previous visit with a dietitian. She participates in exercise intermittently. Her overall blood glucose range is 110-130 mg/dl. (She reports her fasting blood glucose ranges from 110-138, recent A1c was 7.9%.) An ACE inhibitor/angiotensin II receptor blocker is not being taken. Eye exam is current.  Hyperlipidemia This is a chronic problem. The current episode started more than 1 year ago. Exacerbating diseases include diabetes. Pertinent negatives include no chest pain, myalgias or shortness of breath. Current antihyperlipidemic treatment includes statins. Risk factors for coronary artery disease include dyslipidemia and diabetes mellitus.    Review of Systems  Constitutional: Negative for chills, fever and unexpected weight change.  HENT: Negative for trouble swallowing and voice change.   Eyes: Negative for visual disturbance.  Respiratory: Negative for cough, shortness of breath and wheezing.   Cardiovascular: Negative for chest pain, palpitations and leg swelling.  Gastrointestinal: Negative for diarrhea, nausea and vomiting.  Endocrine: Negative for cold intolerance, heat intolerance, polydipsia, polyphagia and polyuria.  Musculoskeletal: Negative for arthralgias and myalgias.  Skin: Negative for color change, pallor, rash and wound.  Neurological: Negative for seizures and headaches.  Psychiatric/Behavioral: Negative for  confusion and suicidal ideas.    Objective:    BP 109/76 (BP Location: Left Arm, Patient Position: Sitting, Cuff Size: Normal)   Pulse 98   Temp 97.8 F (36.6 C) (Oral)   Ht 5\' 10"  (1.778 m)   Wt 179 lb 3.2 oz (81.3 kg)   SpO2 95%   BMI 25.71 kg/m   Wt Readings from Last 3 Encounters:  02/26/19 179 lb 3.2 oz (81.3 kg)  01/06/18 175 lb 8 oz (79.6 kg)  08/02/16 181 lb 8 oz (82.3 kg)     Physical Exam Constitutional:      Appearance: She is well-developed.  HENT:     Head: Normocephalic and atraumatic.  Neck:  Musculoskeletal: Normal range of motion and neck supple.     Thyroid: No thyromegaly.     Trachea: No tracheal deviation.  Cardiovascular:     Rate and Rhythm: Normal rate.  Pulmonary:     Effort: Pulmonary effort is normal.  Abdominal:     Tenderness: There is no abdominal tenderness. There is no guarding.  Musculoskeletal: Normal range of motion.  Skin:    General: Skin is warm and dry.     Coloration: Skin is not pale.     Findings: No erythema or rash.  Neurological:     Mental Status: She is alert and oriented to person, place, and time.     Cranial Nerves: No cranial nerve deficit.     Coordination: Coordination normal.     Deep Tendon Reflexes: Reflexes are normal and symmetric.  Psychiatric:        Judgment: Judgment normal.     CMP ( most recent) CMP     Component Value Date/Time   NA 134 (L) 03/15/2012 1715   K 3.7 03/15/2012 1715   CL 99 03/15/2012 1715   CO2 26 03/15/2012 1715   GLUCOSE 105 (H) 03/15/2012 1715   BUN 14 01/06/2019   CREATININE 0.7 01/06/2019   CREATININE 0.61 03/15/2012 1715   CALCIUM 9.6 03/15/2012 1715   GFRNONAA >90 03/15/2012 1715   GFRAA >90 03/15/2012 1715    Diabetic Labs (most recent): Lab Results  Component Value Date   HGBA1C 7.9 01/06/2019    Lab Results  Component Value Date   TSH 1.85 01/06/2019     Assessment & Plan:   1. Uncontrolled type 2 diabetes mellitus with hyperglycemia ([place])  -  [place] has currently uncontrolled symptomatic type 2 DM since  53 years of age,  with most recent A1c of 7.9 %. Recent labs reviewed. - I had a long discussion with her about the progressive nature of diabetes and the pathology behind its complications. -She does not report any complications, however,  she remains at a high risk for acute and chronic complications which include CAD, CVA, CKD, retinopathy, and neuropathy. These are all discussed in detail with her.  - I have counseled her on diet  and weight management  by adopting a carbohydrate restricted/protein rich diet. Patient is encouraged to switch to  unprocessed or minimally processed     complex starch and increased protein intake (animal or plant source), fruits, and vegetables. -  she is advised to stick to a routine mealtimes to eat 3 meals  a day and avoid unnecessary snacks ( to snack only to correct hypoglycemia).   - she admits that there is a room for improvement in her food and drink choices. - Suggestion is made for her to avoid simple carbohydrates  from her diet including Cakes, Sweet Desserts, Ice Cream, Soda (diet and regular), Sweet Tea, Candies, Chips, Cookies, Store Bought Juices, Alcohol in Excess of  1-2 drinks a day, Artificial Sweeteners,  Coffee Creamer, and "Sugar-free" Products. This will help patient to have more stable blood glucose profile and potentially avoid unintended weight gain.  - she will be scheduled with Jearld Fenton, RDN, CDE for diabetes education.  - I have approached her with the following individualized plan to manage  her diabetes and patient agrees:   -Given her recent diagnosis and personal motivation, she will be given a chance to treat it without medications by maximizing lifestyle modification.  She has some concern about metformin related to its  GI side effects.   -If her next visit A1c is greater than 7%, she will be considered for options of treatment.  - Specific targets for   A1c;  LDL, HDL, Triglycerides, and  Waist Circumference were discussed with the patient.  2) Blood Pressure /Hypertension:  her blood pressure is  controlled to target.  She is not on antihypertensive medications currently.  3) Lipids/Hyperlipidemia: Her recent lipid panels are not available to review.  She is on Crestor 5 mg p.o. nightly, she is advised to continue.   4)  Weight/Diet:  Body mass index is 25.71 kg/m.  -   she is not a candidate for weight loss.  However, she would benefit from exercise, and detailed carbohydrates information provided  -  detailed on discharge instructions.  5) Chronic Care/Health Maintenance:  -she  Is  on Statin medications and  is encouraged to initiate and continue to follow up with Ophthalmology, Dentist,  Podiatrist at least yearly or according to recommendations, and advised to  stay away from smoking. I have recommended yearly flu vaccine and pneumonia vaccine at least every 5 years; moderate intensity exercise for up to 150 minutes weekly; and  sleep for at least 7 hours a day.  - she is  advised to maintain close follow up with Su Grand, MD for primary care needs, as well as her other providers for optimal and coordinated care.  - Time spent with the patient: 45 minutes, of which >50% was spent in obtaining information about her symptoms, reviewing her previous labs/studies, evaluations, and treatments, counseling her about her new onset type 2 diabetes, hyperlipidemia, and developing plans for long term treatment based on the latest standards of care/guidelines.  Please refer to " Patient Self Inventory" in the Media  tab for reviewed elements of pertinent patient history.  [place] participated in the discussions, expressed understanding, and voiced agreement with the above plans.  All questions were answered to her satisfaction. she is encouraged to contact clinic should she have any questions or concerns prior to her return  visit.  Follow up plan: - Return in about 9 weeks (around 04/30/2019) for Next Visit A1c in Office.  Glade Lloyd, MD [facility] [address] [place], [place] [address] Phone: [phone]  Fax: [phone]    02/26/2019, 1:39 PM  This note was partially dictated with voice recognition software. Similar sounding words can be transcribed inadequately or may not  be corrected upon review.

## 2019-03-10 ENCOUNTER — Encounter: Payer: Self-pay | Admitting: Internal Medicine

## 2019-03-10 ENCOUNTER — Ambulatory Visit: Payer: BLUE CROSS/BLUE SHIELD | Admitting: Internal Medicine

## 2019-03-10 VITALS — BP 116/80 | HR 106 | Temp 98.0°F | Ht 70.0 in | Wt 179.0 lb

## 2019-03-10 DIAGNOSIS — K52832 Lymphocytic colitis: Secondary | ICD-10-CM

## 2019-03-10 DIAGNOSIS — K219 Gastro-esophageal reflux disease without esophagitis: Secondary | ICD-10-CM | POA: Diagnosis not present

## 2019-03-10 MED ORDER — FAMOTIDINE 20 MG PO TABS: 20.0000 mg | ORAL_TABLET | Freq: Every day | ORAL | 3 refills | Status: DC

## 2019-03-10 MED ORDER — PANTOPRAZOLE SODIUM 40 MG PO TBEC: DELAYED_RELEASE_TABLET | ORAL | 3 refills | Status: DC

## 2019-03-10 NOTE — Patient Instructions (Addendum)
We have sent the following medications to your pharmacy for you to pick up at your convenience:  Pepcid (20mg ) Pantoprazole  Please follow up in one year

## 2019-03-10 NOTE — Progress Notes (Signed)
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  She was subsequently diagnosed with lymphocytic colitis on colonoscopy and treated with budesonide.  She was last seen in this office January 06, 2018.  At that time she was doing well off budesonide.  Though she would describe 3 or 4 bowel movements on occasion, for the most part she was dramatically better.  She was denying urgency and incontinence at that time.  Using Imodium sparingly.  Routine follow-up in 1 year recommended.  She does feel that her bowel habits have been about the same though some issues with urgency.  May be using Imodium a bit more.  She was found to have an elevated hemoglobin A1c of 6.9 which is being treated with diet and exercise.  She is reluctant to resume budesonide at this time and feels she may manage her symptoms with Imodium.  Still, much better than they were prior to her diagnosis and initial treatment session with budesonide.  In terms of reflux, she is doing fairly well with pantoprazole in the morning.  No longer taking ranitidine at night due to its recall.  She did feel that at night H2 receptor antagonist therapy on demand was helpful.  She was doing a good job with exercise and weight loss until COVID.  She did gain some weight which has recently come under control.  No new complaints  REVIEW OF SYSTEMS:  All non-GI ROS negative unless otherwise stated in the HPI except for arthritis  Past Medical History:  Diagnosis Date  . Allergy   . Arthritis   . Breast cancer ([place])   . Colon polyp   . Gastric irritation   . GERD (gastroesophageal reflux disease)   . Hyperlipidemia   . Internal hemorrhoid     Past Surgical History:  Procedure Laterality Date  . ABDOMINAL HYSTERECTOMY    . BREAST LUMPECTOMY    . COLONOSCOPY    . portacath removal     . RECTAL POLYPECTOMY      Social History [place] R Weesner  reports that she has never smoked. She has never used smokeless tobacco. She reports current alcohol use. She reports that she does not use drugs.  family history includes Diabetes in her maternal grandmother and mother.  Allergies  Allergen Reactions  . Erythromycin Anaphylaxis    Patient allergic to all of the mycin medications       PHYSICAL EXAMINATION: Vital signs: BP 116/80 (BP Location: Left Arm, Patient Position: Sitting, Cuff Size: Normal)   Pulse (!) 106   Temp 98 F (36.7 C) (Oral)   Ht 5\' 10"  (1.778 m)   Wt 179 lb (81.2 kg)   BMI 25.68 kg/m   Constitutional: generally well-appearing, no acute distress Psychiatric: alert and oriented x3, cooperative Eyes: extraocular movements intact, anicteric, conjunctiva pink Mouth: oral pharynx moist, no lesions Neck: supple no lymphadenopathy Cardiovascular: heart regular rate and rhythm, no murmur Lungs: clear to auscultation bilaterally Abdomen: soft, nontender, nondistended, no obvious ascites, no peritoneal signs, normal bowel sounds, no organomegaly Rectal: Omitted Extremities: no clubbing, cyanosis, or lower extremity edema bilaterally Skin: no lesions on visible extremities Neuro: No focal deficits.  Cranial nerves intact  ASSESSMENT:  1.  Lymphocytic colitis.  She remains off budesonide.  Currently using Imodium on demand.  Much better than prior to her initial diagnosis though  seems to be slightly worse than her visit last year.  She does not want to resume budesonide at this point but is open to that if needed. 2.  GERD.  Some at night breakthrough now that she is off ranitidine.  No dysphasia. 3.  Previous colonoscopy 2016   PLAN:  1.  Reflux precautions 2.  Refill pantoprazole 40 mg daily 3.  Prescribed famotidine 20 mg at night.  Medication risks reviewed 4.  Okay to use Imodium on demand for diarrhea 5.  Contact this office if symptoms worsen,  we are happy to prescribe budesonide therapy again 6.  Routine GI office follow-up 1 year.  Sooner if needed 25-minute spent face-to-face with the patient  50% the time used for counseling regarding her issues with loose stools, lymphocytic colitis, and GERD

## 2019-04-07 ENCOUNTER — Encounter: Payer: BC Managed Care – PPO | Attending: "Endocrinology | Admitting: Nutrition

## 2019-04-07 ENCOUNTER — Encounter: Payer: Self-pay | Admitting: Nutrition

## 2019-04-07 ENCOUNTER — Other Ambulatory Visit: Payer: Self-pay

## 2019-04-07 VITALS — Ht 70.0 in | Wt 174.8 lb

## 2019-04-07 DIAGNOSIS — E782 Mixed hyperlipidemia: Secondary | ICD-10-CM

## 2019-04-07 DIAGNOSIS — E1165 Type 2 diabetes mellitus with hyperglycemia: Secondary | ICD-10-CM

## 2019-04-07 NOTE — Progress Notes (Signed)
  Medical Nutrition Therapy:  Appt start time: [id number] end time:  1630.   Assessment:  Primary concerns today:  Diabetes Type 2,   Has occasional wine  1-x a week or  Month.   Sees Dr. Dorris Fetch, Endocrinology. Recently cut out sweets. Did drink sweet tea and has cut out sweet tea. Cut out sweets and chips and drinking water Lost 5 lbs. Has hurt her  back and hasn't been able to exercise as she wants to.Marland Kitchen DJD and hurt her back. 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 Tests BS sporadically. Needs better commitment to timing of meals, more fresh fruits and vegetables and not skipping meals. She is not taking anything for her DM right now. She wants to change diet and exercise first.   Lab Results  Component Value Date   HGBA1C 7.9 01/06/2019   CMP Latest Ref Rng & Units 01/06/2019 03/15/2012  Glucose 70 - 99 mg/dL - 105(H)  BUN 4 - 21 14 18   Creatinine 0.5 - 1.1 0.7 0.61  Sodium 135 - 145 mEq/L - 134(L)  Potassium 3.5 - 5.1 mEq/L - 3.7  Chloride 96 - 112 mEq/L - 99  CO2 19 - 32 mEq/L - 26  Calcium 8.4 - 10.5 mg/dL - 9.6     Preferred Learning Style:   No preference indicated   Learning Readiness:   Ready  Change in progress   MEDICATIONS:   DIETARY INTAKE:  24-hr recall:  Eats 2-3 times per day. Skips meals at times and eats sporadic due to schedule.  Usual physical activity: Walks some  Estimated energy needs: 1500 calories 170 g carbohydrates 112 g protein 42 g fat  Progress Towards Goal(s):  In progress.   Nutritional Diagnosis:  NB-1.1 Food and nutrition-related knowledge deficit As related to Diabetes Type 2, .  As evidenced by A1C 7.9%.    Intervention:  Nutrition and Diabetes education provided on My Plate, CHO counting, meal planning, portion sizes, timing of meals, avoiding snacks between meals unless having a low blood sugar, target ranges for A1C and blood sugars, signs/symptoms and treatment of hyper/hypoglycemia, monitoring blood sugars, taking medications  as prescribed, benefits of exercising 30 minutes per day and prevention of complications of DM. Follow up next Nida appt.  Goals Follow Plate Method Eat 45 grams of carbs per meal Cut out soda, tea, snacks, Walk  5,000 steps a day or do Yoga as tolerated. Get A1C down to 6.5% or less in next 3 months.    Teaching Method Utilized:  Visual Auditory Hands on  Handouts given during visit include:  The Plate Method   Meal Plan Card   Diabetes Instrucitons   Barriers to learning/adherence to lifestyle change: none  Demonstrated degree of understanding via:  Teach Back   Monitoring/Evaluation:  Dietary intake, exercise, , and body weight in 1 month(s).

## 2019-04-07 NOTE — Patient Instructions (Addendum)
Follow up next Nida appt.  Goals Follow Plate Method Eat 45 grams of carbs per meal Cut out soda, tea, snacks, Walk  5,000 steps a day or do Yoga as tolerated. Get A1C down to 6.5% or less in next 3 months.

## 2019-04-14 ENCOUNTER — Encounter: Payer: Self-pay | Admitting: Nutrition

## 2019-04-26 ENCOUNTER — Encounter: Payer: Self-pay | Admitting: Nutrition

## 2019-04-30 ENCOUNTER — Ambulatory Visit: Payer: BC Managed Care – PPO | Admitting: "Endocrinology

## 2019-04-30 ENCOUNTER — Encounter: Payer: Self-pay | Admitting: "Endocrinology

## 2019-04-30 ENCOUNTER — Other Ambulatory Visit: Payer: Self-pay

## 2019-04-30 VITALS — BP 131/76 | HR 86 | Ht 70.0 in | Wt 174.0 lb

## 2019-04-30 DIAGNOSIS — E782 Mixed hyperlipidemia: Secondary | ICD-10-CM | POA: Diagnosis not present

## 2019-04-30 DIAGNOSIS — E1165 Type 2 diabetes mellitus with hyperglycemia: Secondary | ICD-10-CM

## 2019-04-30 LAB — POCT GLYCOSYLATED HEMOGLOBIN (HGB A1C): Hemoglobin A1C: 7.4 % — AB (ref 4.0–5.6)

## 2019-04-30 MED ORDER — ROSUVASTATIN CALCIUM 10 MG PO TABS: 10.0000 mg | ORAL_TABLET | Freq: Every day | ORAL | 3 refills | Status: DC

## 2019-04-30 NOTE — Progress Notes (Signed)
04/30/2019, 10:06 AM  Endocrinology follow-up note   Subjective:    Patient ID: Carolyn Porter, female    DOB: 1966/04/24.  Carolyn Porter is being seen in follow-up after she was seen in consultation for management of currently uncontrolled symptomatic diabetes requested by  Su Grand, MD.   Past Medical History:  Diagnosis Date  . Allergy   . Arthritis   . Breast cancer ([place])   . Colon polyp   . Gastric irritation   . GERD (gastroesophageal reflux disease)   . Hyperlipidemia   . Internal hemorrhoid     Past Surgical History:  Procedure Laterality Date  . ABDOMINAL HYSTERECTOMY    . BREAST LUMPECTOMY    . COLONOSCOPY    . portacath removal    . RECTAL POLYPECTOMY      Social History   Socioeconomic History  . Marital status: Married    Spouse name: Not on file  . Number of children: 2  . Years of education: Not on file  . Highest education level: Not on file  Occupational History  . Occupation: Therapist, sports  Social Needs  . Financial resource strain: Not on file  . Food insecurity    Worry: Not on file    Inability: Not on file  . Transportation needs    Medical: Not on file    Non-medical: Not on file  Tobacco Use  . Smoking status: Never Smoker  . Smokeless tobacco: Never Used  Substance and Sexual Activity  . Alcohol use: Yes    Alcohol/week: 0.0 standard drinks    Comment: occ  . Drug use: No  . Sexual activity: Yes    Birth control/protection: Surgical  Lifestyle  . Physical activity    Days per week: Not on file    Minutes per session: Not on file  . Stress: Not on file  Relationships  . Social Herbalist on phone: Not on file    Gets together: Not on file    Attends religious service: Not on file    Active member of club or organization: Not on file    Attends meetings of clubs or organizations: Not on file    Relationship status: Not on file  Other  Topics Concern  . Not on file  Social History Narrative  . Not on file    Family History  Problem Relation Age of Onset  . Diabetes Mother   . Diabetes Maternal Grandmother     Outpatient Encounter Medications as of 04/30/2019  Medication Sig  . aspirin EC 81 MG tablet Take 81 mg by mouth daily.  . Cholecalciferol (VITAMIN D3) 5000 units CAPS Take 1 capsule by mouth daily.  Marland Kitchen co-enzyme Q-10 30 MG capsule Take 30 mg by mouth 3 (three) times daily.  . famotidine (PEPCID) 20 MG tablet Take 1 tablet (20 mg total) by mouth at bedtime.  . fluticasone (FLONASE) 50 MCG/ACT nasal spray Place 2 sprays into both nostrils as needed.   . loperamide (IMODIUM A-D) 2 MG tablet Take 2 mg by mouth as needed for diarrhea or loose stools.  . Multiple Vitamin (MULTIVITAMIN) tablet Take 3 tablets by mouth  daily.   . pantoprazole (PROTONIX) 40 MG tablet TAKE 1 TABLET BY MOUTH EVERY DAY,  . rosuvastatin (CRESTOR) 10 MG tablet Take 1 tablet (10 mg total) by mouth at bedtime.  . [DISCONTINUED] rosuvastatin (CRESTOR) 10 MG tablet Take 10 mg by mouth.   No facility-administered encounter medications on file as of 04/30/2019.     ALLERGIES: Allergies  Allergen Reactions  . Erythromycin Anaphylaxis    Patient allergic to all of the mycin medications    VACCINATION STATUS:  There is no immunization history on file for this patient.  Diabetes She presents for her follow-up diabetic visit. She has type 2 diabetes mellitus. Onset time: She was diagnosed at approximate age of 53 years. Her disease course has been improving. There are no hypoglycemic associated symptoms. Pertinent negatives for hypoglycemia include no confusion, headaches, pallor or seizures. There are no diabetic associated symptoms. Pertinent negatives for diabetes include no chest pain, no polydipsia, no polyphagia and no polyuria. There are no hypoglycemic complications. Symptoms are improving. There are no diabetic complications. Risk factors  for coronary artery disease include diabetes mellitus and dyslipidemia. When asked about current treatments, none were reported. Her weight is decreasing steadily. She is following a generally unhealthy diet. When asked about meal planning, she reported none. She has not had a previous visit with a dietitian. She participates in exercise intermittently. An ACE inhibitor/angiotensin II receptor blocker is not being taken. Eye exam is current.  Hyperlipidemia This is a chronic problem. The current episode started more than 1 year ago. Exacerbating diseases include diabetes. Pertinent negatives include no chest pain, myalgias or shortness of breath. Current antihyperlipidemic treatment includes statins. Risk factors for coronary artery disease include dyslipidemia and diabetes mellitus.    Review of Systems  Constitutional: Negative for chills, fever and unexpected weight change.  HENT: Negative for trouble swallowing and voice change.   Eyes: Negative for visual disturbance.  Respiratory: Negative for cough, shortness of breath and wheezing.   Cardiovascular: Negative for chest pain, palpitations and leg swelling.  Gastrointestinal: Negative for diarrhea, nausea and vomiting.  Endocrine: Negative for cold intolerance, heat intolerance, polydipsia, polyphagia and polyuria.  Musculoskeletal: Negative for arthralgias and myalgias.  Skin: Negative for color change, pallor, rash and wound.  Neurological: Negative for seizures and headaches.  Psychiatric/Behavioral: Negative for confusion and suicidal ideas.    Objective:    BP 131/76   Pulse 86   Ht 5\' 10"  (1.778 m)   Wt 174 lb (78.9 kg)   BMI 24.97 kg/m   Wt Readings from Last 3 Encounters:  04/30/19 174 lb (78.9 kg)  04/07/19 174 lb 12.8 oz (79.3 kg)  03/10/19 179 lb (81.2 kg)     Physical Exam Constitutional:      Appearance: She is well-developed.  HENT:     Head: Normocephalic and atraumatic.  Neck:     Musculoskeletal: Normal  range of motion and neck supple.     Thyroid: No thyromegaly.     Trachea: No tracheal deviation.  Cardiovascular:     Rate and Rhythm: Normal rate.  Pulmonary:     Effort: Pulmonary effort is normal.  Abdominal:     Tenderness: There is no abdominal tenderness. There is no guarding.  Musculoskeletal: Normal range of motion.  Skin:    General: Skin is warm and dry.     Coloration: Skin is not pale.     Findings: No erythema or rash.  Neurological:     Mental Status: She is  alert and oriented to person, place, and time.     Cranial Nerves: No cranial nerve deficit.     Coordination: Coordination normal.     Deep Tendon Reflexes: Reflexes are normal and symmetric.  Psychiatric:        Judgment: Judgment normal.     CMP ( most recent) CMP     Component Value Date/Time   NA 134 (L) 03/15/2012 1715   K 3.7 03/15/2012 1715   CL 99 03/15/2012 1715   CO2 26 03/15/2012 1715   GLUCOSE 105 (H) 03/15/2012 1715   BUN 14 01/06/2019   CREATININE 0.7 01/06/2019   CREATININE 0.61 03/15/2012 1715   CALCIUM 9.6 03/15/2012 1715   GFRNONAA >90 03/15/2012 1715   GFRAA >90 03/15/2012 1715    Diabetic Labs (most recent): Lab Results  Component Value Date   HGBA1C 7.4 (A) 04/30/2019   HGBA1C 7.9 01/06/2019    Lab Results  Component Value Date   TSH 1.85 01/06/2019     Assessment & Plan:   1. Uncontrolled type 2 diabetes mellitus with hyperglycemia ([place])  - [place] has currently uncontrolled symptomatic type 2 DM since  53 years of age,  with most recent A1c of 7.4% improving from 7.9 %. Recent labs reviewed. - I had a long discussion with her about the progressive nature of diabetes and the pathology behind its complications. -She does not report any complications, however,  she remains at a high risk for acute and chronic complications which include CAD, CVA, CKD, retinopathy, and neuropathy. These are all discussed in detail with her.  - I have counseled her on diet  and  weight management  by adopting a carbohydrate restricted/protein rich diet. Patient is encouraged to switch to  unprocessed or minimally processed     complex starch and increased protein intake (animal or plant source), fruits, and vegetables. -  she is advised to stick to a routine mealtimes to eat 3 meals  a day and avoid unnecessary snacks ( to snack only to correct hypoglycemia).   - she has significantly modified her diet since last visit, but still admits there is a room for improvement in her diet and drink choices. -  Suggestion is made for her to avoid simple carbohydrates  from her diet including Cakes, Sweet Desserts / Pastries, Ice Cream, Soda (diet and regular), Sweet Tea, Candies, Chips, Cookies, Sweet Pastries,  Store Bought Juices, Alcohol in Excess of  1-2 drinks a day, Artificial Sweeteners, Coffee Creamer, and "Sugar-free" Products. This will help patient to have stable blood glucose profile and potentially avoid unintended weight gain.   - she will be scheduled with Jearld Fenton, RDN, CDE for diabetes education.  - I have approached her with the following individualized plan to manage  her diabetes and patient agrees:   -Given her recent diagnosis and personal motivation resulting in the loss of 5 pounds since last visit and improved A1c of 7.4%, she will be given a chance to treat it without medications by maximizing lifestyle modification.  She has some concern about metformin related to its GI side effects.   -She will return in 73-month for a visit and repeat A1c.  If her next visit A1c is greater than 7%, she will be considered for options of treatment.  - Specific targets for  A1c;  LDL, HDL, Triglycerides, and  Waist Circumference were discussed with the patient.  2) Blood Pressure /Hypertension: Her blood pressure is controlled to target.  She  is not on antihypertensive medications currently.  3) Lipids/Hyperlipidemia: Her recent lipid panels are not available to  review.  She is advised to continue Crestor 5 mg p.o. nightly, refills done for her.    4)  Weight/Diet:  Body mass index is 24.97 kg/m.  -   she is not a candidate for weight loss.  However, she would benefit from exercise, and detailed carbohydrates information provided  -  detailed on discharge instructions.  5) Chronic Care/Health Maintenance:  -she  Is  on Statin medications and  is encouraged to initiate and continue to follow up with Ophthalmology, Dentist,  Podiatrist at least yearly or according to recommendations, and advised to  stay away from smoking. I have recommended yearly flu vaccine and pneumonia vaccine at least every 5 years; moderate intensity exercise for up to 150 minutes weekly; and  sleep for at least 7 hours a day.  - she is  advised to maintain close follow up with Su Grand, MD for primary care needs, as well as her other providers for optimal and coordinated care.  - Patient Care Time Today:  25 min, of which >50% was spent in  counseling and the rest reviewing her  current and  previous labs/studies, previous treatments, her blood glucose readings, and medications' doses and developing a plan for long-term care based on the latest recommendations for standards of care.   [place] participated in the discussions, expressed understanding, and voiced agreement with the above plans.  All questions were answered to her satisfaction. she is encouraged to contact clinic should she have any questions or concerns prior to her return visit.   Follow up plan: - Return in about 4 months (around 08/28/2019) for Next Visit A1c in Office.  Glade Lloyd, MD [facility] [address] [place], [place] [address] Phone: [phone]  Fax: [phone]    04/30/2019, 10:06 AM  This note was partially dictated with voice recognition software. Similar sounding words can be transcribed inadequately or may not  be  corrected upon review.

## 2019-05-31 ENCOUNTER — Telehealth: Payer: Self-pay | Admitting: "Endocrinology

## 2019-05-31 MED ORDER — GLUCOSE BLOOD VI STRP: 1.0000 | ORAL_STRIP | Freq: Four times a day (QID) | 5 refills | Status: DC

## 2019-05-31 MED ORDER — LANCETS ULTRA FINE MISC: 1.0000 | Freq: Two times a day (BID) | 5 refills | Status: DC

## 2019-05-31 NOTE — Telephone Encounter (Signed)
Rx sent to [facility]

## 2019-05-31 NOTE — Telephone Encounter (Signed)
Patient needs strips and lancets for her accu check guide, [facility] [place]

## 2019-09-02 ENCOUNTER — Encounter: Payer: BC Managed Care – PPO | Attending: "Endocrinology | Admitting: Nutrition

## 2019-09-02 ENCOUNTER — Ambulatory Visit: Payer: BC Managed Care – PPO | Admitting: "Endocrinology

## 2019-09-02 ENCOUNTER — Encounter: Payer: Self-pay | Admitting: Nutrition

## 2019-09-02 ENCOUNTER — Other Ambulatory Visit: Payer: Self-pay

## 2019-09-02 ENCOUNTER — Encounter: Payer: Self-pay | Admitting: "Endocrinology

## 2019-09-02 VITALS — BP 116/76 | HR 92 | Ht 70.0 in | Wt 174.6 lb

## 2019-09-02 VITALS — Wt 174.0 lb

## 2019-09-02 DIAGNOSIS — E1165 Type 2 diabetes mellitus with hyperglycemia: Secondary | ICD-10-CM

## 2019-09-02 DIAGNOSIS — E782 Mixed hyperlipidemia: Secondary | ICD-10-CM

## 2019-09-02 LAB — POCT GLYCOSYLATED HEMOGLOBIN (HGB A1C): Hemoglobin A1C: 7.4 % — AB (ref 4.0–5.6)

## 2019-09-02 MED ORDER — OZEMPIC (0.25 OR 0.5 MG/DOSE) 2 MG/1.5ML ~~LOC~~ SOPN: 0.5000 mg | PEN_INJECTOR | SUBCUTANEOUS | 1 refills | Status: DC

## 2019-09-02 NOTE — Progress Notes (Signed)
09/02/2019, 10:53 PM  Endocrinology follow-up note   Subjective:    Patient ID: Carolyn Porter, female    DOB: 22-Jan-1966.  Carolyn Porter is being seen in follow-up after she was seen in consultation for management of currently uncontrolled symptomatic diabetes requested by  Su Grand, MD.   Past Medical History:  Diagnosis Date  . Allergy   . Arthritis   . Breast cancer ([place])   . Colon polyp   . Gastric irritation   . GERD (gastroesophageal reflux disease)   . Hyperlipidemia   . Internal hemorrhoid     Past Surgical History:  Procedure Laterality Date  . ABDOMINAL HYSTERECTOMY    . BREAST LUMPECTOMY    . COLONOSCOPY    . portacath removal    . RECTAL POLYPECTOMY      Social History   Socioeconomic History  . Marital status: Married    Spouse name: Not on file  . Number of children: 2  . Years of education: Not on file  . Highest education level: Not on file  Occupational History  . Occupation: Therapist, sports  Tobacco Use  . Smoking status: Never Smoker  . Smokeless tobacco: Never Used  Substance and Sexual Activity  . Alcohol use: Yes    Alcohol/week: 0.0 standard drinks    Comment: occ  . Drug use: No  . Sexual activity: Yes    Birth control/protection: Surgical  Other Topics Concern  . Not on file  Social History Narrative  . Not on file   Social Determinants of Health   Financial Resource Strain:   . Difficulty of Paying Living Expenses:   Food Insecurity:   . Worried About Charity fundraiser in the Last Year:   . Arboriculturist in the Last Year:   Transportation Needs:   . Film/video editor (Medical):   Marland Kitchen Lack of Transportation (Non-Medical):   Physical Activity:   . Days of Exercise per Week:   . Minutes of Exercise per Session:   Stress:   . Feeling of Stress :   Social Connections:   . Frequency of Communication with Friends and Family:   . Frequency of  Social Gatherings with Friends and Family:   . Attends Religious Services:   . Active Member of Clubs or Organizations:   . Attends Archivist Meetings:   Marland Kitchen Marital Status:     Family History  Problem Relation Age of Onset  . Diabetes Mother   . Diabetes Maternal Grandmother     Outpatient Encounter Medications as of 09/02/2019  Medication Sig  . aspirin EC 81 MG tablet Take 81 mg by mouth daily.  . Cholecalciferol (VITAMIN D3) 5000 units CAPS Take 1 capsule by mouth daily.  Marland Kitchen co-enzyme Q-10 30 MG capsule Take 30 mg by mouth daily.   . famotidine (PEPCID) 20 MG tablet Take 1 tablet (20 mg total) by mouth at bedtime.  . fluticasone (FLONASE) 50 MCG/ACT nasal spray Place 2 sprays into both nostrils as needed.   Marland Kitchen glucose blood test strip 1 each by Other route 4 (four) times daily. Use as instructed qid E11.65 AccuChek Guide  . Lancets  Ultra Fine MISC 1 each by Other route 2 (two) times daily. Test BG bid. E11.65 AccuChek Guide  . loperamide (IMODIUM A-D) 2 MG tablet Take 2 mg by mouth as needed for diarrhea or loose stools.  . Multiple Vitamin (MULTIVITAMIN) tablet Take 3 tablets by mouth daily.   . pantoprazole (PROTONIX) 40 MG tablet TAKE 1 TABLET BY MOUTH EVERY DAY,  . rosuvastatin (CRESTOR) 10 MG tablet Take 1 tablet (10 mg total) by mouth at bedtime.  . Semaglutide,0.25 or 0.5MG /DOS, (OZEMPIC, 0.25 OR 0.5 MG/DOSE,) 2 MG/1.5ML SOPN Inject 0.5 mg into the skin once a week.   No facility-administered encounter medications on file as of 09/02/2019.    ALLERGIES: Allergies  Allergen Reactions  . Erythromycin Anaphylaxis    Patient allergic to all of the mycin medications    VACCINATION STATUS:  There is no immunization history on file for this patient.  Diabetes She presents for her follow-up diabetic visit. She has type 2 diabetes mellitus. Onset time: She was diagnosed at approximate age of 40 years. Her disease course has been stable. There are no hypoglycemic  associated symptoms. Pertinent negatives for hypoglycemia include no confusion, headaches, pallor or seizures. There are no diabetic associated symptoms. Pertinent negatives for diabetes include no chest pain, no polydipsia, no polyphagia and no polyuria. There are no hypoglycemic complications. Symptoms are stable. There are no diabetic complications. Risk factors for coronary artery disease include diabetes mellitus and dyslipidemia. When asked about current treatments, none were reported. Her weight is fluctuating minimally. She is following a generally unhealthy diet. When asked about meal planning, she reported none. She has not had a previous visit with a dietitian. She participates in exercise intermittently. An ACE inhibitor/angiotensin II receptor blocker is not being taken. Eye exam is current.  Hyperlipidemia This is a chronic problem. The current episode started more than 1 year ago. Exacerbating diseases include diabetes. Pertinent negatives include no chest pain, myalgias or shortness of breath. Current antihyperlipidemic treatment includes statins. Risk factors for coronary artery disease include dyslipidemia and diabetes mellitus.    Review of Systems  Constitutional: Negative for chills, fever and unexpected weight change.  HENT: Negative for trouble swallowing and voice change.   Eyes: Negative for visual disturbance.  Respiratory: Negative for cough, shortness of breath and wheezing.   Cardiovascular: Negative for chest pain, palpitations and leg swelling.  Gastrointestinal: Negative for diarrhea, nausea and vomiting.  Endocrine: Negative for cold intolerance, heat intolerance, polydipsia, polyphagia and polyuria.  Musculoskeletal: Negative for arthralgias and myalgias.  Skin: Negative for color change, pallor, rash and wound.  Neurological: Negative for seizures and headaches.  Psychiatric/Behavioral: Negative for confusion and suicidal ideas.    Objective:    BP 116/76    Pulse 92   Ht 5\' 10"  (1.778 m)   Wt 174 lb 9.6 oz (79.2 kg)   BMI 25.05 kg/m   Wt Readings from Last 3 Encounters:  09/02/19 174 lb 9.6 oz (79.2 kg)  09/02/19 174 lb (78.9 kg)  04/30/19 174 lb (78.9 kg)     Physical Exam Constitutional:      Appearance: She is well-developed.  HENT:     Head: Normocephalic and atraumatic.  Neck:     Thyroid: No thyromegaly.     Trachea: No tracheal deviation.  Cardiovascular:     Rate and Rhythm: Normal rate.  Pulmonary:     Effort: Pulmonary effort is normal.  Abdominal:     Tenderness: There is no abdominal tenderness. There is  no guarding.  Musculoskeletal:        General: Normal range of motion.     Cervical back: Normal range of motion and neck supple.  Skin:    General: Skin is warm and dry.     Coloration: Skin is not pale.     Findings: No erythema or rash.  Neurological:     Mental Status: She is alert and oriented to person, place, and time.     Cranial Nerves: No cranial nerve deficit.     Coordination: Coordination normal.     Deep Tendon Reflexes: Reflexes are normal and symmetric.  Psychiatric:        Judgment: Judgment normal.     CMP ( most recent) CMP     Component Value Date/Time   NA 134 (L) 03/15/2012 1715   K 3.7 03/15/2012 1715   CL 99 03/15/2012 1715   CO2 26 03/15/2012 1715   GLUCOSE 105 (H) 03/15/2012 1715   BUN 14 01/06/2019 0000   CREATININE 0.7 01/06/2019 0000   CREATININE 0.61 03/15/2012 1715   CALCIUM 9.6 03/15/2012 1715   GFRNONAA >90 03/15/2012 1715   GFRAA >90 03/15/2012 1715    Diabetic Labs (most recent): Lab Results  Component Value Date   HGBA1C 7.4 (A) 09/02/2019   HGBA1C 7.4 (A) 04/30/2019   HGBA1C 7.9 01/06/2019    Lab Results  Component Value Date   TSH 1.85 01/06/2019     Assessment & Plan:   1. Uncontrolled type 2 diabetes mellitus with hyperglycemia ([place])  - [place] has currently uncontrolled symptomatic type 2 DM since  54 years of age,  with most recent  A1c remaining at  7.4% improving from 7.9 %. Recent labs reviewed. - I had a long discussion with her about the progressive nature of diabetes and the pathology behind its complications. -She does not report any complications, however,  she remains at a high risk for acute and chronic complications which include CAD, CVA, CKD, retinopathy, and neuropathy. These are all discussed in detail with her.  - I have counseled her on diet  and weight management  by adopting a carbohydrate restricted/protein rich diet. Patient is encouraged to switch to  unprocessed or minimally processed     complex starch and increased protein intake (animal or plant source), fruits, and vegetables. -  she is advised to stick to a routine mealtimes to eat 3 meals  a day and avoid unnecessary snacks ( to snack only to correct hypoglycemia).   - she has significantly modified her diet since last visit, but still admits there is a room for improvement in her diet and drink choices. -  Suggestion is made for her to avoid simple carbohydrates  from her diet including Cakes, Sweet Desserts / Pastries, Ice Cream, Soda (diet and regular), Sweet Tea, Candies, Chips, Cookies, Sweet Pastries,  Store Bought Juices, Alcohol in Excess of  1-2 drinks a day, Artificial Sweeteners, Coffee Creamer, and "Sugar-free" Products. This will help patient to have stable blood glucose profile and potentially avoid unintended weight gain.   - she will be scheduled with Jearld Fenton, RDN, CDE for diabetes education.  - I have approached her with the following individualized plan to manage  her diabetes and patient agrees:   -Given sightly above target a1c of  7.4%, she will need a therapeutic agent to give her better control. She has several options, but  She has some concern about metformin related to its GI side effects.   -  I discussed and offered her weekly Ozempic and she is in agreement. _She will start with Ozempic 0.25 mg weekly x 2 weeks,  then increase to 0.5 mg weekly.  - Specific targets for  A1c;  LDL, HDL, Triglycerides, and  Waist Circumference were discussed with the patient.  2) Blood Pressure /Hypertension: Her blood pressure is controlled to target.  She is not on antihypertensive medications currently.  3) Lipids/Hyperlipidemia: Her recent lipid panels are not available to review.  She is advised to continue Crestor 10 mg p.o. nightly, refills done for her.    4)  Weight/Diet:  Body mass index is 25.05 kg/m.  -   she is not a candidate for weight loss.  However, she would benefit from exercise, and detailed carbohydrates information provided  -  detailed on discharge instructions.  5) Chronic Care/Health Maintenance:  -she  Is  on Statin medications and  is encouraged to initiate and continue to follow up with Ophthalmology, Dentist,  Podiatrist at least yearly or according to recommendations, and advised to  stay away from smoking. I have recommended yearly flu vaccine and pneumonia vaccine at least every 5 years; moderate intensity exercise for up to 150 minutes weekly; and  sleep for at least 7 hours a day.  - she is  advised to maintain close follow up with Su Grand, MD for primary care needs, as well as her other providers for optimal and coordinated care.  - Time spent on this patient care encounter:  35 min, of which > 50% was spent in  counseling and the rest reviewing her blood glucose logs , discussing her hypoglycemia and hyperglycemia episodes, reviewing her current and  previous labs / studies  ( including abstraction from other facilities) and medications  doses and developing a  long term treatment plan and documenting her care.   Please refer to Patient Instructions for Blood Glucose Monitoring and Insulin/Medications Dosing Guide"  in media tab for additional information. Please  also refer to " Patient Self Inventory" in the Media  tab for reviewed elements of pertinent patient  history.  [place] participated in the discussions, expressed understanding, and voiced agreement with the above plans.  All questions were answered to her satisfaction. she is encouraged to contact clinic should she have any questions or concerns prior to her return visit.    Follow up plan: - Return in about 4 months (around 01/02/2020) for Follow up with Pre-visit Labs, Next Visit A1c in Office.  Glade Lloyd, MD [facility] [address] [place], [place] [address] Phone: [phone]  Fax: [phone]    09/02/2019, 10:53 PM  This note was partially dictated with voice recognition software. Similar sounding words can be transcribed inadequately or may not  be corrected upon review.

## 2019-09-02 NOTE — Patient Instructions (Signed)
Goals  Keep up the good job! Increase physical activity 60 minutes  3-4 times per week Try MYFitnessPal App to track foods and exercise Increase fresh fruits and vegetables Eat 30 g CHO at meals Dont skip meals Don't eat past 7 pm Drink 5 + bottle of water per day Get in 10,000 steps a day.

## 2019-09-02 NOTE — Patient Instructions (Signed)

## 2019-09-02 NOTE — Progress Notes (Signed)
  Medical Nutrition Therapy:  Appt start time: 1115 end time:  1130   Assessment:  Primary concerns today:  Diabetes Type 2, Follow up. To see Dr. Dorris Fetch today. A1c 7,4% sam as it was 3 months ago.  Made changes Exercising some. Has cut back on sugar intake. Has increased fresh fruits and vegetalbes. Cut out fried foods. Drinking water and occasionally low calorie tea, milk. Occasionally 1 alcholo drink 1 every 2 weeks. 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 To start [place] today per Dr. Dorris Fetch. She is willing to work on exercise to help with DM and reduce stress.   Lab Results  Component Value Date   HGBA1C 7.4 (A) 09/02/2019   CMP Latest Ref Rng & Units 01/06/2019 03/15/2012  Glucose 70 - 99 mg/dL - 105(H)  BUN 4 - 21 14 18   Creatinine 0.5 - 1.1 0.7 0.61  Sodium 135 - 145 mEq/L - 134(L)  Potassium 3.5 - 5.1 mEq/L - 3.7  Chloride 96 - 112 mEq/L - 99  CO2 19 - 32 mEq/L - 26  Calcium 8.4 - 10.5 mg/dL - 9.6     Preferred Learning Style:   No preference indicated   Learning Readiness:   Ready  Change in progress   MEDICATIONS:   DIETARY INTAKE:  24-hr recall:  Eats 2-3 times per day. Skips meals at times and eats sporadic due to schedule.  Usual physical activity: Walks some  Estimated energy needs: 1500 calories 170 g carbohydrates 112 g protein 42 g fat  Progress Towards Goal(s):  In progress.   Nutritional Diagnosis:  NB-1.1 Food and nutrition-related knowledge deficit As related to Diabetes Type 2, .  As evidenced by A1C 7.9%.    Intervention:  Nutrition and Diabetes education provided on My Plate, CHO counting, meal planning, portion sizes, timing of meals, avoiding snacks between meals unless having a low blood sugar, target ranges for A1C and blood sugars, signs/symptoms and treatment of hyper/hypoglycemia, monitoring blood sugars, taking medications as prescribed, benefits of exercising 30 minutes  per day and prevention of complications of DM. Follow up next [place] appt.  Goals  Keep up the good job! Increase physical activity 60 minutes  3-4 times per week Try MYFitnessPal App to track foods and exercise Increase fresh fruits and vegetables Eat 30 g CHO at meals Dont skip meals Don't eat past 7 pm Drink 5 + bottle of water per day Get in 10,000 steps a day.    Teaching Method Utilized:  Visual Auditory Hands on  Handouts given during visit include:  The Plate Method   Meal Plan Card   Diabetes Instrucitons   Barriers to learning/adherence to lifestyle change: none  Demonstrated degree of understanding via:  Teach Back   Monitoring/Evaluation:  Dietary intake, exercise, , and body weight in 3 month(s).

## 2019-09-03 ENCOUNTER — Ambulatory Visit: Payer: BC Managed Care – PPO | Admitting: "Endocrinology

## 2019-09-10 ENCOUNTER — Telehealth: Payer: Self-pay | Admitting: "Endocrinology

## 2019-09-10 NOTE — Telephone Encounter (Signed)
Patient said that she was just prescribed Ozempic at last visit. 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 She said she is not sure she should be using the ozempic.   She may not answer if you try to call so just leave a message on her VM on what to do Carolyn Porter)

## 2019-09-10 NOTE — Telephone Encounter (Signed)
Certainly, we have choices for her. Long term effects of Ozempic on diabetic retinopathy are not known, but there was a slightly increased risk compared with placebo.  She can wait until she is cleared by her ophthalmologist  for [place] . We may also consider other options next visit.

## 2019-09-13 NOTE — Telephone Encounter (Signed)
Discussed with pt, understanding voiced. 

## 2020-01-06 ENCOUNTER — Ambulatory Visit: Payer: BC Managed Care – PPO | Admitting: Nutrition

## 2020-01-06 ENCOUNTER — Ambulatory Visit: Payer: BC Managed Care – PPO | Admitting: "Endocrinology

## 2020-02-02 ENCOUNTER — Other Ambulatory Visit: Payer: Self-pay | Admitting: Internal Medicine

## 2020-02-27 ENCOUNTER — Telehealth: Payer: Self-pay | Admitting: "Endocrinology

## 2020-03-01 ENCOUNTER — Encounter: Payer: Self-pay | Admitting: Internal Medicine

## 2020-03-01 ENCOUNTER — Ambulatory Visit (INDEPENDENT_AMBULATORY_CARE_PROVIDER_SITE_OTHER): Payer: BC Managed Care – PPO | Admitting: Internal Medicine

## 2020-03-01 VITALS — BP 124/90 | HR 84 | Ht 68.75 in | Wt 180.2 lb

## 2020-03-01 DIAGNOSIS — R197 Diarrhea, unspecified: Secondary | ICD-10-CM | POA: Diagnosis not present

## 2020-03-01 DIAGNOSIS — K52832 Lymphocytic colitis: Secondary | ICD-10-CM

## 2020-03-01 DIAGNOSIS — K219 Gastro-esophageal reflux disease without esophagitis: Secondary | ICD-10-CM

## 2020-03-01 DIAGNOSIS — Z86010 Personal history of colonic polyps: Secondary | ICD-10-CM | POA: Diagnosis not present

## 2020-03-01 MED ORDER — FAMOTIDINE 20 MG PO TABS
20.0000 mg | ORAL_TABLET | Freq: Every day | ORAL | 3 refills | Status: DC
Start: 2020-03-01 — End: 2021-08-27

## 2020-03-01 MED ORDER — SUTAB 1479-225-188 MG PO TABS: 1.0000 | ORAL_TABLET | Freq: Once | ORAL | 0 refills | Status: AC

## 2020-03-01 MED ORDER — PANTOPRAZOLE SODIUM 40 MG PO TBEC: DELAYED_RELEASE_TABLET | ORAL | 3 refills | Status: DC

## 2020-03-01 NOTE — Patient Instructions (Signed)
We have sent the following medications to your pharmacy for you to pick up at your convenience:  Protonic,, Pepcid  Take your Imodium more regularly.  You have been scheduled for a colonoscopy. Please follow written instructions given to you at your visit today.  Please pick up your prep supplies at the pharmacy within the next 1-3 days. If you use inhalers (even only as needed), please bring them with you on the day of your procedure.

## 2020-03-01 NOTE — Progress Notes (Signed)
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  She was subsequently diagnosed with lymphocytic colitis on colonoscopy and treated with budesonide.  She was seen in this office January 06, 2018.  At that time she was doing well off budesonide.  Though she would describe 3 or 4 bowel movements on occasion, for the most part she was dramatically better.  She was denying urgency and incontinence at that time.  Using Imodium sparingly.  Routine follow-up in 1 year recommended.  As such, she was seen in March 10, 2019.  At that time, She felt that her bowel habits have been about the same though some issues with urgency.  May be using Imodium a bit more.  She was found to have an elevated hemoglobin A1c of 6.9 which is being treated with diet and exercise.  She was reluctant to resume budesonide at this time and feels she may manage her symptoms with Imodium.  Still, much better than they were prior to her diagnosis and initial treatment session with budesonide.  She presents today for follow-up.  She will tells me that she has 5-10 bowel movements per day which are not formed.  She states that symptoms are worse with stress.  Most of the bowel movements occur in the morning.  She takes Imodium less than once per day.  Still reluctant to use budesonide.  Denies issues with urgency. In terms of reflux, she is doing fairly well with pantoprazole in the morning.    Also taking famotidine 20 mg at night.   Her weight is unchanged from last year.  Previous colonoscopy with inflammatory colon polyp October 2016.  Last hemoglobin A1c January 06, 2019 was 7.9.  She has not been vaccinated against Covid.  REVIEW OF SYSTEMS:  All non-GI ROS negative unless otherwise stated in the HPI except for fatigue, arthritis, visual change, seasonal sinus and  allergy  Past Medical History:  Diagnosis Date  . Allergy   . Arthritis   . Breast cancer ([place])   . Colon polyp   . Gastric irritation   . GERD (gastroesophageal reflux disease)   . Hyperlipidemia   . Internal hemorrhoid     Past Surgical History:  Procedure Laterality Date  . ABDOMINAL HYSTERECTOMY    . BREAST LUMPECTOMY    . COLONOSCOPY    . portacath removal    . RECTAL POLYPECTOMY      Social History [name]  reports that she has never smoked. She has never used smokeless tobacco. She reports current alcohol use. She reports that she does not use drugs.  family history includes Diabetes in her maternal grandmother and mother.  Allergies  Allergen Reactions  . Erythromycin Anaphylaxis    Patient allergic to all of the mycin medications       PHYSICAL EXAMINATION: Vital signs: BP 124/90 (BP Location: Left Arm, Patient Position: Sitting, Cuff Size: Normal)   Pulse 84   Ht 5' 8.75" (1.746 m) Comment: height measured without shoes  Wt 180 lb 4 oz (81.8 kg)   BMI 26.81 kg/m   Constitutional: generally well-appearing, no acute distress Psychiatric: alert and oriented x3, cooperative Eyes: extraocular movements intact, anicteric, conjunctiva pink Mouth: Mask Neck: supple no lymphadenopathy Cardiovascular: heart regular rate and rhythm, no murmur Lungs: clear to auscultation bilaterally Abdomen: soft, nontender, nondistended, no obvious  ascites, no peritoneal signs, normal bowel sounds, no organomegaly Rectal: Deferred to colonoscopy Extremities: no clubbing, cyanosis, or lower extremity edema bilaterally Skin: no lesions on visible extremities Neuro: No focal deficits.  Cranial nerves intact  ASSESSMENT:  1.  Lymphocytic colitis.  Off budesonide.  Significantly worse compared to last year.  Not as bad as her initial presentation. 2.  Last colonoscopy 2016.  In addition to lymphocytic colitis, hyperplastic colon polyp. 3.  GERD.  Managed with morning PPI  and at night H2 receptor antagonist (as needed). 4.  General medical problems   PLAN:  1.  Refill pantoprazole 40 mg daily.  Medication risks reviewed 2.  Refill famotidine 20 mg at night.  Medication risks reviewed 3.  Reflux precautions 4.  May use Imodium more liberally 5.  Schedule repeat colonoscopy with biopsies to assess for persistent microscopic colitis and colon polyp surveillance.she may have a component of IBS.  The nature of the procedure, as well as the risks, benefits, and alternatives were carefully and thoroughly reviewed with the patient. Ample time for discussion and questions allowed. The patient understood, was satisfied, and agreed to proceed. 6.  Routine office follow-up annually.  Sooner if needed

## 2020-03-08 ENCOUNTER — Other Ambulatory Visit: Payer: Self-pay | Admitting: "Endocrinology

## 2020-03-08 MED ORDER — ROSUVASTATIN CALCIUM 10 MG PO TABS: 10.0000 mg | ORAL_TABLET | Freq: Every day | ORAL | 0 refills | Status: DC

## 2020-03-08 NOTE — Telephone Encounter (Signed)
I will refill her Rosu.

## 2020-03-08 NOTE — Telephone Encounter (Signed)
Pt said her medication was denied bc she knows she missed her last appt. She said she has been in covid exposure a lot and that is why she has not been back. She is going to get her labs done as soon as she receives the order in the mail. She is asking for a refill of her Rosuvastatin Calcium 10 MG until appt. She will call and make an appt after labs are done.

## 2020-03-17 ENCOUNTER — Ambulatory Visit (INDEPENDENT_AMBULATORY_CARE_PROVIDER_SITE_OTHER): Payer: BC Managed Care – PPO

## 2020-03-17 ENCOUNTER — Other Ambulatory Visit: Payer: Self-pay | Admitting: Internal Medicine

## 2020-03-17 DIAGNOSIS — Z1159 Encounter for screening for other viral diseases: Secondary | ICD-10-CM

## 2020-03-17 LAB — SARS CORONAVIRUS 2 (TAT 6-24 HRS): SARS Coronavirus 2: NEGATIVE

## 2020-03-21 ENCOUNTER — Encounter: Payer: Self-pay | Admitting: Internal Medicine

## 2020-03-21 ENCOUNTER — Other Ambulatory Visit: Payer: Self-pay

## 2020-03-21 ENCOUNTER — Ambulatory Visit (AMBULATORY_SURGERY_CENTER): Payer: BC Managed Care – PPO | Admitting: Internal Medicine

## 2020-03-21 VITALS — BP 138/71 | HR 68 | Temp 98.0°F | Resp 11 | Ht 68.0 in | Wt 180.0 lb

## 2020-03-21 DIAGNOSIS — K52832 Lymphocytic colitis: Secondary | ICD-10-CM

## 2020-03-21 DIAGNOSIS — Z86010 Personal history of colonic polyps: Secondary | ICD-10-CM

## 2020-03-21 DIAGNOSIS — R197 Diarrhea, unspecified: Secondary | ICD-10-CM

## 2020-03-21 DIAGNOSIS — K529 Noninfective gastroenteritis and colitis, unspecified: Secondary | ICD-10-CM

## 2020-03-21 MED ORDER — SODIUM CHLORIDE 0.9 % IV SOLN: 500.0000 mL | Freq: Once | INTRAVENOUS | Status: DC

## 2020-03-21 NOTE — Progress Notes (Signed)
Report to PACU, RN, vss, BBS= Clear.  

## 2020-03-21 NOTE — Op Note (Signed)
[facility] Patient Name: Carolyn Porter Procedure Date: 03/21/2020 11:52 AM MRN: [id number] Endoscopist: Docia Chuck. Henrene Pastor , MD Age: 54 Referring MD:  Date of Birth: [birth year] Gender: Female Account #: [id number] Procedure:                Colonoscopy with biopsies Indications:              High risk colon cancer surveillance: Personal                            history of colonic polyps, Incidental diarrhea                            noted. History of inflammatory polyp and                            microscopic colitis. Ongoing diarrhea. Now for                            reevaluation. Medicines:                Monitored Anesthesia Care Procedure:                Pre-Anesthesia Assessment:                           - Prior to the procedure, a History and Physical                            was performed, and patient medications and                            allergies were reviewed. The patient's tolerance of                            previous anesthesia was also reviewed. The risks                            and benefits of the procedure and the sedation                            options and risks were discussed with the patient.                            All questions were answered, and informed consent                            was obtained. Prior Anticoagulants: The patient has                            taken no previous anticoagulant or antiplatelet                            agents. ASA Grade Assessment: II - A patient with  mild systemic disease. After reviewing the risks                            and benefits, the patient was deemed in                            satisfactory condition to undergo the procedure.                           After obtaining informed consent, the colonoscope                            was passed under direct vision. Throughout the                            procedure, the patient's blood pressure, pulse, and                             oxygen saturations were monitored continuously. The                            Colonoscope was introduced through the anus and                            advanced to the the cecum, identified by                            appendiceal orifice and ileocecal valve. The                            terminal ileum, ileocecal valve, appendiceal                            orifice, and rectum were photographed. The quality                            of the bowel preparation was excellent. The                            colonoscopy was performed without difficulty. The                            patient tolerated the procedure well. The bowel                            preparation used was SUPREP via split dose                            instruction. Scope In: 12:02:36 PM Scope Out: 12:18:59 PM Scope Withdrawal Time: 0 hours 13 minutes 14 seconds  Total Procedure Duration: 0 hours 16 minutes 23 seconds  Findings:                 The terminal ileum appeared normal.  The colon revealed mild colitis involving the                            rectum, sigmoid, and descending portions. This was                            manifested by punctate aphthous and mild erythema.                            Biopsies were taken with a cold forceps for                            histology. The remainder of the colon was normal on                            direct and retroflexed views. The unremarkable                            right colon and transverse colon were also biopsied. Complications:            No immediate complications. Estimated blood loss:                            None. Estimated Blood Loss:     Estimated blood loss: none. Impression:               - The examined portion of the ileum was normal.                           - Mild left-sided colitis, status post biopsies.                           - Otherwise normal colon on direct and retroflexed                             views. Status post biopsies Recommendation:           - Repeat colonoscopy in 10 years for surveillance.                           - Patient has a contact number available for                            emergencies. The signs and symptoms of potential                            delayed complications were discussed with the                            patient. Return to normal activities tomorrow.                            Written discharge instructions were provided to the  patient.                           - Resume previous diet.                           - Continue present medications.                           - Await pathology results. Dr. Henrene Pastor will contact                            you with the results and additional recommendations                            as indicated. Docia Chuck. Henrene Pastor, MD 03/21/2020 12:27:04 PM This report has been signed electronically.

## 2020-03-21 NOTE — Progress Notes (Signed)
Called to room to assist during endoscopic procedure.  Patient ID and intended procedure confirmed with present staff. Received instructions for my participation in the procedure from the performing physician.  

## 2020-03-21 NOTE — Progress Notes (Signed)
Vital signs checked by:SF  The medical and surgical history was reviewed and verified with the patient. 

## 2020-03-21 NOTE — Patient Instructions (Signed)
YOU HAD AN ENDOSCOPIC PROCEDURE TODAY AT THE [facility]:   Refer to the procedure report that was given to you for any specific questions about what was found during the examination.  If the procedure report does not answer your questions, please call your gastroenterologist to clarify.  If you requested that your care partner not be given the details of your procedure findings, then the procedure report has been included in a sealed envelope for you to review at your convenience later.  YOU SHOULD EXPECT: Some feelings of bloating in the abdomen. Passage of more gas than usual.  Walking can help get rid of the air that was put into your GI tract during the procedure and reduce the bloating. If you had a lower endoscopy (such as a colonoscopy or flexible sigmoidoscopy) you may notice spotting of blood in your stool or on the toilet paper. If you underwent a bowel prep for your procedure, you may not have a normal bowel movement for a few days.  Please Note:  You might notice some irritation and congestion in your nose or some drainage.  This is from the oxygen used during your procedure.  There is no need for concern and it should clear up in a day or so.  SYMPTOMS TO REPORT IMMEDIATELY:  Following lower endoscopy (colonoscopy or flexible sigmoidoscopy):  Excessive amounts of blood in the stool  Significant tenderness or worsening of abdominal pains  Swelling of the abdomen that is new, acute  Fever of 100F or higher    For urgent or emergent issues, a gastroenterologist can be reached at any hour by calling [phone]. Do not use MyChart messaging for urgent concerns.    DIET:  We do recommend a small meal at first, but then you may proceed to your regular diet.  Drink plenty of fluids but you should avoid alcoholic beverages for 24 hours.  ACTIVITY:  You should plan to take it easy for the rest of today and you should NOT DRIVE or use heavy machinery until tomorrow (because  of the sedation medicines used during the test).    FOLLOW UP: Our staff will call the number listed on your records 48-72 hours following your procedure to check on you and address any questions or concerns that you may have regarding the information given to you following your procedure. If we do not reach you, we will leave a message.  We will attempt to reach you two times.  During this call, we will ask if you have developed any symptoms of COVID 19. If you develop any symptoms (ie: fever, flu-like symptoms, shortness of breath, cough etc.) before then, please call [phone].  If you test positive for Covid 19 in the 2 weeks post procedure, please call and report this information to us.    If any biopsies were taken you will be contacted by phone or by letter within the next 1-3 weeks.  Please call us at [phone] if you have not heard about the biopsies in 3 weeks.    SIGNATURES/CONFIDENTIALITY: You and/or your care partner have signed paperwork which will be entered into your electronic medical record.  These signatures attest to the fact that that the information above on your After Visit Summary has been reviewed and is understood.  Full responsibility of the confidentiality of this discharge information lies with you and/or your care-partner.    Resume medications. 

## 2020-03-23 ENCOUNTER — Telehealth: Payer: Self-pay

## 2020-03-23 NOTE — Telephone Encounter (Signed)
  Follow up Call-  Call back number 03/21/2020  Post procedure Call Back phone  # [phone]  Permission to leave phone message Yes  Some recent data might be hidden     Patient questions:  Do you have a fever, pain , or abdominal swelling? No. Pain Score  0 *  Have you tolerated food without any problems? Yes.   Pt states she is eating more bland foods and is increasing as tolerated but is doing good with soups and toast. Will continue to advance diet and let us know if she has any problems.   Have you been able to return to your normal activities? Yes.    Do you have any questions about your discharge instructions: Diet   No. Medications  No. Follow up visit  No.  Do you have questions or concerns about your Care? No.  Actions: * If pain score is 4 or above: No action needed, pain <4.   1. Have you developed a fever since your procedure? No   2.   Have you had an respiratory symptoms (SOB or cough) since your procedure? No   3.   Have you tested positive for COVID 19 since your procedure? No   4.   Have you had any family members/close contacts diagnosed with the COVID 19 since your procedure?  No    If yes to any of these questions please route to Joylene John, RN and Joella Prince, RN

## 2020-03-24 ENCOUNTER — Encounter: Payer: Self-pay | Admitting: Internal Medicine

## 2020-03-27 ENCOUNTER — Other Ambulatory Visit: Payer: Self-pay

## 2020-03-27 MED ORDER — BUDESONIDE 3 MG PO CPEP: 9.0000 mg | ORAL_CAPSULE | Freq: Every day | ORAL | 2 refills | Status: DC

## 2020-04-06 LAB — TSH: TSH: 2.88 (ref 0.41–5.90)

## 2020-04-06 LAB — BASIC METABOLIC PANEL
BUN: 21 (ref 4–21)
Creatinine: 0.8 (ref 0.5–1.1)

## 2020-04-06 LAB — LIPID PANEL
Cholesterol: 208 — AB (ref 0–200)
HDL: 50 (ref 35–70)
LDL Cholesterol: 135
Triglycerides: 131 (ref 40–160)

## 2020-04-06 LAB — COMPREHENSIVE METABOLIC PANEL
Calcium: 9.9 (ref 8.7–10.7)
GFR calc Af Amer: 93
GFR calc non Af Amer: 81

## 2020-05-01 ENCOUNTER — Encounter: Payer: Self-pay | Admitting: Internal Medicine

## 2020-05-01 ENCOUNTER — Ambulatory Visit (INDEPENDENT_AMBULATORY_CARE_PROVIDER_SITE_OTHER): Payer: BC Managed Care – PPO | Admitting: Internal Medicine

## 2020-05-01 VITALS — BP 120/82 | HR 80 | Ht 68.0 in | Wt 179.0 lb

## 2020-05-01 DIAGNOSIS — K219 Gastro-esophageal reflux disease without esophagitis: Secondary | ICD-10-CM | POA: Diagnosis not present

## 2020-05-01 DIAGNOSIS — K52839 Microscopic colitis, unspecified: Secondary | ICD-10-CM | POA: Diagnosis not present

## 2020-05-01 MED ORDER — PANTOPRAZOLE SODIUM 40 MG PO TBEC: DELAYED_RELEASE_TABLET | ORAL | 3 refills | Status: DC

## 2020-05-01 MED ORDER — BUDESONIDE 3 MG PO CPEP
9.0000 mg | ORAL_CAPSULE | Freq: Every day | ORAL | 2 refills | Status: DC
Start: 2020-05-01 — End: 2021-05-23

## 2020-05-01 NOTE — Patient Instructions (Signed)
We have sent the following medications to your pharmacy for you to pick up at your convenience:  Pantoprazole, Budesonide  Please follow up in 3 months

## 2020-05-01 NOTE — Progress Notes (Signed)
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  She was subsequently diagnosed with lymphocytic colitis and treated with budesonide.  She was last seen in this office in follow-up March 01, 2020.  At that time she reported significantly worse diarrhea compared to her previous.  She had been off budesonide for some time.  Reflux ring was managed with PPI and H2 receptor antagonist therapy.  She is continued on those therapies.  Follow-up colonoscopy scheduled and performed March 21, 2020.  Examination, including intubation of the terminal ileum, was normal except for mild left-sided colitis.  Random biopsies throughout the entire colon revealed lymphocytic colitis.  She was placed on budesonide 9 mg daily for 4 weeks.  She recently decreased this to 6 mg daily as directed.  She presents now for follow-up.  She is tolerating the medication well without side effects.  Her morning blood glucose levels are generally around 110-120.  No worsening with budesonide.  She does have improved energy.  She has had marked improvement in her bowel movements.  Specifically, she has gone from 10 loose stools per day to 5 or less stools per day which are formed 50 to 75% of the time.  She uses Imodium once daily infrequently.  No new GI issues or complaints  REVIEW OF SYSTEMS:  All non-GI ROS negative unless otherwise stated in the HPI except for headaches, back pain, fatigue, arthritis  Past Medical History:  Diagnosis Date  . Allergy   . Arthritis   . Breast cancer ([employer])   . Colon polyp   . Gastric irritation   . GERD (gastroesophageal reflux disease)   . Hyperlipidemia   . Internal hemorrhoid     Past Surgical History:  Procedure Laterality Date  . ABDOMINAL HYSTERECTOMY    . BREAST LUMPECTOMY    . COLONOSCOPY    . portacath removal     . RECTAL POLYPECTOMY      Social History Milicent R Lurry  reports that she has never smoked. She has never used smokeless tobacco. She reports current alcohol use. She reports that she does not use drugs.  family history includes Diabetes in her maternal grandmother and mother.  Allergies  Allergen Reactions  . Erythromycin Anaphylaxis    Patient allergic to all of the mycin medications       PHYSICAL EXAMINATION: Vital signs: BP 120/82   Pulse 80   Ht 5\' 8"  (1.727 m)   Wt 179 lb (81.2 kg)   SpO2 92% Comment: patient wearing nails with dark color  BMI 27.22 kg/m   Constitutional: generally well-appearing, no acute distress Psychiatric: alert and oriented x3, cooperative Eyes: Anicteric Mouth: Mask Abdomen: Not reexamined Skin: no obvious lesions on visible extremities Neuro: Grossly intact  ASSESSMENT:  1.  Lymphocytic colitis.  Improved on budesonide 2.  GERD.  On PPI and H2 receptor antagonist therapy 3.  Recent colonoscopy as described   PLAN:  1.  Continue budesonide 6 mg daily for 4 weeks then decrease to 3 mg daily for 4 weeks then stop.  Contact the office for relapsing symptoms.  Medication effects and risks reviewed. 2.  Okay to use Imodium more liberally, if helpful 3.  Routine office follow-up 3 months 4.  Surveillance colonoscopy 10 years

## 2020-05-03 ENCOUNTER — Encounter: Payer: Self-pay | Admitting: "Endocrinology

## 2020-05-03 ENCOUNTER — Ambulatory Visit (INDEPENDENT_AMBULATORY_CARE_PROVIDER_SITE_OTHER): Payer: BC Managed Care – PPO | Admitting: "Endocrinology

## 2020-05-03 ENCOUNTER — Other Ambulatory Visit: Payer: Self-pay

## 2020-05-03 VITALS — BP 110/68 | HR 76 | Ht 68.0 in | Wt 178.4 lb

## 2020-05-03 DIAGNOSIS — E782 Mixed hyperlipidemia: Secondary | ICD-10-CM

## 2020-05-03 DIAGNOSIS — E1165 Type 2 diabetes mellitus with hyperglycemia: Secondary | ICD-10-CM | POA: Diagnosis not present

## 2020-05-03 LAB — POCT GLYCOSYLATED HEMOGLOBIN (HGB A1C): Hemoglobin A1C: 8.1 % — AB (ref 4.0–5.6)

## 2020-05-03 MED ORDER — METFORMIN HCL ER 500 MG PO TB24: 500.0000 mg | ORAL_TABLET | Freq: Every day | ORAL | 3 refills | Status: DC

## 2020-05-03 MED ORDER — ROSUVASTATIN CALCIUM 20 MG PO TABS: 20.0000 mg | ORAL_TABLET | Freq: Every day | ORAL | 1 refills | Status: DC

## 2020-05-03 NOTE — Patient Instructions (Signed)

## 2020-05-03 NOTE — Progress Notes (Signed)
05/03/2020, 11:33 AM  Endocrinology follow-up note   Subjective:    Patient ID: Carolyn Porter, female    DOB: June 26, 1965.  Carolyn Porter is being seen in follow-up after she was seen in consultation for management of currently uncontrolled symptomatic diabetes requested by  Marquis Buggy, [place].   Past Medical History:  Diagnosis Date  . Allergy   . Arthritis   . Breast cancer ([place])   . Colon polyp   . Gastric irritation   . GERD (gastroesophageal reflux disease)   . Hyperlipidemia   . Internal hemorrhoid     Past Surgical History:  Procedure Laterality Date  . ABDOMINAL HYSTERECTOMY    . BREAST LUMPECTOMY    . COLONOSCOPY    . portacath removal    . RECTAL POLYPECTOMY      Social History   Socioeconomic History  . Marital status: Married    Spouse name: Not on file  . Number of children: 2  . Years of education: Not on file  . Highest education level: Not on file  Occupational History  . Occupation: Therapist, sports  Tobacco Use  . Smoking status: Never Smoker  . Smokeless tobacco: Never Used  Substance and Sexual Activity  . Alcohol use: Yes    Alcohol/week: 0.0 standard drinks    Comment: occ  . Drug use: No  . Sexual activity: Yes    Birth control/protection: Surgical  Other Topics Concern  . Not on file  Social History Narrative  . Not on file   Social Determinants of Health   Financial Resource Strain:   . Difficulty of Paying Living Expenses: Not on file  Food Insecurity:   . Worried About Charity fundraiser in the Last Year: Not on file  . Ran Out of Food in the Last Year: Not on file  Transportation Needs:   . Lack of Transportation (Medical): Not on file  . Lack of Transportation (Non-Medical): Not on file  Physical Activity:   . Days of Exercise per Week: Not on file  . Minutes of Exercise per Session: Not on file  Stress:   . Feeling of Stress : Not on file  Social  Connections:   . Frequency of Communication with Friends and Family: Not on file  . Frequency of Social Gatherings with Friends and Family: Not on file  . Attends Religious Services: Not on file  . Active Member of Clubs or Organizations: Not on file  . Attends Archivist Meetings: Not on file  . Marital Status: Not on file    Family History  Problem Relation Age of Onset  . Diabetes Mother   . Diabetes Maternal Grandmother   . Colon cancer Neg Hx   . Esophageal cancer Neg Hx   . Rectal cancer Neg Hx   . Stomach cancer Neg Hx     Outpatient Encounter Medications as of 05/03/2020  Medication Sig  . Ascorbic Acid (VITAMIN C) 250 MG CHEW Chew 3 tablets by mouth daily.  Marland Kitchen aspirin EC 81 MG tablet Take 81 mg by mouth daily.  . budesonide (ENTOCORT EC) 3 MG 24 hr capsule Take 3 capsules (9 mg total)  by mouth daily. Take 3 daily for 4 weeks, take 2 daily for 4 weeks, take 1 daily for 4 weeks  . Cholecalciferol (VITAMIN D3) 5000 units CAPS Take 1 capsule by mouth daily.  Marland Kitchen co-enzyme Q-10 30 MG capsule Take 30 mg by mouth daily.   . famotidine (PEPCID) 20 MG tablet Take 1 tablet (20 mg total) by mouth at bedtime.  . fluticasone (FLONASE) 50 MCG/ACT nasal spray Place 2 sprays into both nostrils as needed.   Marland Kitchen glucose blood test strip 1 each by Other route 4 (four) times daily. Use as instructed qid E11.65 AccuChek Guide  . Lancets Ultra Fine MISC 1 each by Other route 2 (two) times daily. Test BG bid. E11.65 AccuChek Guide  . loperamide (IMODIUM A-D) 2 MG tablet Take 2 mg by mouth as needed for diarrhea or loose stools.  . metFORMIN (GLUCOPHAGE XR) 500 MG 24 hr tablet Take 1 tablet (500 mg total) by mouth daily with breakfast.  . Multiple Vitamin (MULTIVITAMIN) tablet Take 3 tablets by mouth daily.   . pantoprazole (PROTONIX) 40 MG tablet TAKE 1 TABLET BY MOUTH EVERY DAY,  . rosuvastatin (CRESTOR) 20 MG tablet Take 1 tablet (20 mg total) by mouth at bedtime.  . Zinc 25 MG TABS Take  1 tablet by mouth daily.  . [DISCONTINUED] rosuvastatin (CRESTOR) 10 MG tablet Take 1 tablet (10 mg total) by mouth at bedtime.   No facility-administered encounter medications on file as of 05/03/2020.    ALLERGIES: Allergies  Allergen Reactions  . Erythromycin Anaphylaxis    Patient allergic to all of the mycin medications    VACCINATION STATUS:  There is no immunization history on file for this patient.  Diabetes She presents for her follow-up diabetic visit. She has type 2 diabetes mellitus. Onset time: She was diagnosed at approximate age of 13 years. Her disease course has been worsening. There are no hypoglycemic associated symptoms. Pertinent negatives for hypoglycemia include no confusion, headaches, pallor or seizures. There are no diabetic associated symptoms. Pertinent negatives for diabetes include no chest pain, no polydipsia, no polyphagia and no polyuria. There are no hypoglycemic complications. Symptoms are worsening. There are no diabetic complications. Risk factors for coronary artery disease include diabetes mellitus and dyslipidemia. When asked about current treatments, none were reported. Her weight is fluctuating minimally. She is following a generally unhealthy diet. When asked about meal planning, she reported none. She has not had a previous visit with a dietitian. She participates in exercise intermittently. Her home blood glucose trend is increasing steadily. (She did not bring any logs nor meter to show.  Her point-of-care A1c is 8.1%, increasing from 7.4%.) An ACE inhibitor/angiotensin II receptor blocker is not being taken. Eye exam is current.  Hyperlipidemia This is a chronic problem. The current episode started more than 1 year ago. Exacerbating diseases include diabetes. Pertinent negatives include no chest pain, myalgias or shortness of breath. Current antihyperlipidemic treatment includes statins. Risk factors for coronary artery disease include dyslipidemia  and diabetes mellitus.    Review of Systems  Constitutional: Negative for chills, fever and unexpected weight change.  HENT: Negative for trouble swallowing and voice change.   Eyes: Negative for visual disturbance.  Respiratory: Negative for cough, shortness of breath and wheezing.   Cardiovascular: Negative for chest pain, palpitations and leg swelling.  Gastrointestinal: Negative for diarrhea, nausea and vomiting.  Endocrine: Negative for cold intolerance, heat intolerance, polydipsia, polyphagia and polyuria.  Musculoskeletal: Negative for arthralgias and myalgias.  Skin:  Negative for color change, pallor, rash and wound.  Neurological: Negative for seizures and headaches.  Psychiatric/Behavioral: Negative for confusion and suicidal ideas.    Objective:    BP 110/68   Pulse 76   Ht 5\' 8"  (1.727 m)   Wt 178 lb 6.4 oz (80.9 kg)   BMI 27.13 kg/m   Wt Readings from Last 3 Encounters:  05/03/20 178 lb 6.4 oz (80.9 kg)  05/01/20 179 lb (81.2 kg)  03/21/20 180 lb (81.6 kg)     Physical Exam Constitutional:      Appearance: She is well-developed.  HENT:     Head: Normocephalic and atraumatic.  Neck:     Thyroid: No thyromegaly.     Trachea: No tracheal deviation.  Cardiovascular:     Rate and Rhythm: Normal rate.  Pulmonary:     Effort: Pulmonary effort is normal.  Abdominal:     Tenderness: There is no abdominal tenderness. There is no guarding.  Musculoskeletal:        General: Normal range of motion.     Cervical back: Normal range of motion and neck supple.  Skin:    General: Skin is warm and dry.     Coloration: Skin is not pale.     Findings: No erythema or rash.  Neurological:     Mental Status: She is alert and oriented to person, place, and time.     Cranial Nerves: No cranial nerve deficit.     Coordination: Coordination normal.     Deep Tendon Reflexes: Reflexes are normal and symmetric.  Psychiatric:        Judgment: Judgment normal.     CMP (  most recent) CMP     Component Value Date/Time   NA 134 (L) 03/15/2012 1715   K 3.7 03/15/2012 1715   CL 99 03/15/2012 1715   CO2 26 03/15/2012 1715   GLUCOSE 105 (H) 03/15/2012 1715   BUN 21 04/06/2020 0000   CREATININE 0.8 04/06/2020 0000   CREATININE 0.61 03/15/2012 1715   CALCIUM 9.9 04/06/2020 0000   GFRNONAA 81 04/06/2020 0000   GFRAA 93 04/06/2020 0000    Diabetic Labs (most recent): Lab Results  Component Value Date   HGBA1C 8.1 (A) 05/03/2020   HGBA1C 7.4 (A) 09/02/2019   HGBA1C 7.4 (A) 04/30/2019    Lab Results  Component Value Date   TSH 2.88 04/06/2020   TSH 1.85 01/06/2019     Assessment & Plan:   1. Uncontrolled type 2 diabetes mellitus with hyperglycemia ([place])  - Carolyn Porter has currently uncontrolled symptomatic type 2 DM since  54 years of age. Her point-of-care A1c is 8.1%, increasing from 7.4%.  - Recent labs reviewed. - I had a long discussion with her about the progressive nature of diabetes and the pathology behind its complications. -She does not report any complications, however,  she remains at a high risk for acute and chronic complications which include CAD, CVA, CKD, retinopathy, and neuropathy. These are all discussed in detail with her.  - I have counseled her on diet  and weight management  by adopting a carbohydrate restricted/protein rich diet. Patient is encouraged to switch to  unprocessed or minimally processed     complex starch and increased protein intake (animal or plant source), fruits, and vegetables. -  she is advised to stick to a routine mealtimes to eat 3 meals  a day and avoid unnecessary snacks ( to snack only to correct hypoglycemia).    -  Suggestion is made for her to avoid simple carbohydrates  from her diet including Cakes, Sweet Desserts / Pastries, Ice Cream, Soda (diet and regular), Sweet Tea, Candies, Chips, Cookies, Sweet Pastries,  Store Bought Juices, Alcohol in Excess of  1-2 drinks a day, Artificial  Sweeteners, Coffee Creamer, and "Sugar-free" Products. This will help patient to have stable blood glucose profile and potentially avoid unintended weight gain.    - she will be scheduled with Jearld Fenton, RDN, CDE for diabetes education.  - I have approached her with the following individualized plan to manage  her diabetes and patient agrees:   -She did not start/continue Ozempic.  In light of her increasing A1c, she was approached with options of treatment including glipizide, Trulicity.  However, she wishes to try Metformin, previously hesitated to take Metformin related to its GI effects.  -I discussed and prescribed Metformin 500 mg ER p.o. daily after breakfast.  - Specific targets for  A1c;  LDL, HDL, Triglycerides, were discussed with the patient.  2) Blood Pressure /Hypertension: Her blood pressure is controlled to target.  She is not on antihypertensive medications currently.  3) Lipids/Hyperlipidemia: Her recent lipid panels shows uncontrolled LDL at 135.  She is advised to increase Crestor to 20 mg p.o. nightly.     4)  Weight/Diet:  Body mass index is 27.13 kg/m.  -   she is  a candidate for some  weight loss.  She is advised on exercise, and detailed carbohydrates information provided  -  detailed on discharge instructions.  5) Chronic Care/Health Maintenance:  -she  Is  on Statin medications and  is encouraged to initiate and continue to follow up with Ophthalmology, Dentist,  Podiatrist at least yearly or according to recommendations, and advised to  stay away from smoking. I have recommended yearly flu vaccine and pneumonia vaccine at least every 5 years; moderate intensity exercise for up to 150 minutes weekly; and  sleep for at least 7 hours a day.  - she is  advised to maintain close follow up with Marquis Buggy, FNP for primary care needs, as well as her other providers for optimal and coordinated care.  - Time spent on this patient care encounter:  35 min, of  which > 50% was spent in  counseling and the rest reviewing her blood glucose logs , discussing her hypoglycemia and hyperglycemia episodes, reviewing her current and  previous labs / studies  ( including abstraction from other facilities) and medications  doses and developing a  long term treatment plan and documenting her care.   Please refer to Patient Instructions for Blood Glucose Monitoring and Insulin/Medications Dosing Guide"  in media tab for additional information. Please  also refer to " Patient Self Inventory" in the Media  tab for reviewed elements of pertinent patient history.  [place] participated in the discussions, expressed understanding, and voiced agreement with the above plans.  All questions were answered to her satisfaction. she is encouraged to contact clinic should she have any questions or concerns prior to her return visit.   Follow up plan: - Return for F/U with Pre-visit Labs, A1c -NV, Urine MA - NV.  Glade Lloyd, MD [facility] [address] [place], [place] [address] Phone: [phone]  Fax: [phone]    05/03/2020, 11:33 AM  This note was partially dictated with voice recognition software. Similar sounding words can be transcribed inadequately or may not  be corrected upon review.

## 2020-09-02 ENCOUNTER — Other Ambulatory Visit: Payer: Self-pay | Admitting: "Endocrinology

## 2020-10-27 LAB — BASIC METABOLIC PANEL
BUN: 15 (ref 4–21)
CO2: 21 (ref 13–22)
Chloride: 103 (ref 99–108)
Creatinine: 0.7 (ref 0.5–1.1)
Glucose: 148
Potassium: 4.2 (ref 3.4–5.3)
Sodium: 140 (ref 137–147)

## 2020-10-27 LAB — HEPATIC FUNCTION PANEL
ALT: 27 (ref 7–35)
AST: 18 (ref 13–35)
Alkaline Phosphatase: 112 (ref 25–125)
Bilirubin, Total: 0.5

## 2020-10-27 LAB — LIPID PANEL
Cholesterol: 179 (ref 0–200)
HDL: 48 (ref 35–70)
LDL Cholesterol: 109
Triglycerides: 124 (ref 40–160)

## 2020-10-27 LAB — VITAMIN D 25 HYDROXY (VIT D DEFICIENCY, FRACTURES): Vit D, 25-Hydroxy: 73.7

## 2020-10-27 LAB — TSH: TSH: 1.3 (ref 0.41–5.90)

## 2020-10-27 LAB — COMPREHENSIVE METABOLIC PANEL
Albumin: 4.7 (ref 3.5–5.0)
Calcium: 9.9 (ref 8.7–10.7)
Globulin: 2.4

## 2020-10-28 LAB — HEMOGLOBIN A1C: Hemoglobin A1C: 7.6

## 2020-10-31 ENCOUNTER — Ambulatory Visit: Payer: BC Managed Care – PPO | Admitting: "Endocrinology

## 2020-11-01 ENCOUNTER — Telehealth: Payer: Self-pay | Admitting: "Endocrinology

## 2020-11-01 NOTE — Telephone Encounter (Signed)
Pt is calling and states that she did her labs at [facility] with Marquis Buggy. She states they have not posted in her patient portal. She would like to know if we can see them in [facility] she is unsure what that office uses.

## 2020-11-01 NOTE — Telephone Encounter (Signed)
Called pt, let her know we do have her labs for her upcoming visit.

## 2020-11-09 ENCOUNTER — Encounter: Payer: Self-pay | Admitting: "Endocrinology

## 2020-11-09 ENCOUNTER — Ambulatory Visit (INDEPENDENT_AMBULATORY_CARE_PROVIDER_SITE_OTHER): Payer: BC Managed Care – PPO | Admitting: "Endocrinology

## 2020-11-09 ENCOUNTER — Other Ambulatory Visit: Payer: Self-pay

## 2020-11-09 VITALS — BP 133/81 | HR 88 | Ht 68.0 in | Wt 173.0 lb

## 2020-11-09 DIAGNOSIS — E782 Mixed hyperlipidemia: Secondary | ICD-10-CM

## 2020-11-09 DIAGNOSIS — E1165 Type 2 diabetes mellitus with hyperglycemia: Secondary | ICD-10-CM | POA: Diagnosis not present

## 2020-11-09 MED ORDER — METFORMIN HCL ER 500 MG PO TB24: 500.0000 mg | ORAL_TABLET | Freq: Two times a day (BID) | ORAL | 1 refills | Status: DC

## 2020-11-09 MED ORDER — ROSUVASTATIN CALCIUM 20 MG PO TABS: 20.0000 mg | ORAL_TABLET | Freq: Every day | ORAL | 1 refills | Status: DC

## 2020-11-09 NOTE — Patient Instructions (Signed)

## 2020-11-09 NOTE — Progress Notes (Signed)
11/09/2020, 9:41 PM  Endocrinology follow-up note   Subjective:    Patient ID: Carolyn Porter, female    DOB: [birth year].  Carolyn Porter is being seen in follow-up after she was seen in consultation for management of currently uncontrolled symptomatic diabetes requested by  Marquis Buggy, [place].   Past Medical History:  Diagnosis Date  . Allergy   . Arthritis   . Breast cancer ([place])   . Colon polyp   . Gastric irritation   . GERD (gastroesophageal reflux disease)   . Hyperlipidemia   . Internal hemorrhoid     Past Surgical History:  Procedure Laterality Date  . ABDOMINAL HYSTERECTOMY    . BREAST LUMPECTOMY    . COLONOSCOPY    . portacath removal    . RECTAL POLYPECTOMY      Social History   Socioeconomic History  . Marital status: Married    Spouse name: Not on file  . Number of children: 2  . Years of education: Not on file  . Highest education level: Not on file  Occupational History  . Occupation: Therapist, sports  Tobacco Use  . Smoking status: Never Smoker  . Smokeless tobacco: Never Used  Substance and Sexual Activity  . Alcohol use: Yes    Alcohol/week: 0.0 standard drinks    Comment: occ  . Drug use: No  . Sexual activity: Yes    Birth control/protection: Surgical  Other Topics Concern  . Not on file  Social History Narrative  . Not on file   Social Determinants of Health   Financial Resource Strain: Not on file  Food Insecurity: Not on file  Transportation Needs: Not on file  Physical Activity: Not on file  Stress: Not on file  Social Connections: Not on file    Family History  Problem Relation Age of Onset  . Diabetes Mother   . Diabetes Maternal Grandmother   . Colon cancer Neg Hx   . Esophageal cancer Neg Hx   . Rectal cancer Neg Hx   . Stomach cancer Neg Hx     Outpatient Encounter Medications as of 11/09/2020  Medication Sig  . Ascorbic Acid (VITAMIN C) 250 MG CHEW  Chew 3 tablets by mouth daily.  Marland Kitchen aspirin EC 81 MG tablet Take 81 mg by mouth daily.  . budesonide (ENTOCORT EC) 3 MG 24 hr capsule Take 3 capsules (9 mg total) by mouth daily. Take 3 daily for 4 weeks, take 2 daily for 4 weeks, take 1 daily for 4 weeks  . co-enzyme Q-10 30 MG capsule Take 30 mg by mouth daily.   . famotidine (PEPCID) 20 MG tablet Take 1 tablet (20 mg total) by mouth at bedtime.  . fluticasone (FLONASE) 50 MCG/ACT nasal spray Place 2 sprays into both nostrils as needed.   Marland Kitchen glucose blood test strip 1 each by Other route 4 (four) times daily. Use as instructed qid E11.65 AccuChek Guide  . Lancets Ultra Fine MISC 1 each by Other route 2 (two) times daily. Test BG bid. E11.65 AccuChek Guide  . loperamide (IMODIUM A-D) 2 MG tablet Take 2 mg by mouth as needed for diarrhea or loose stools.  Marland Kitchen  metFORMIN (GLUCOPHAGE-XR) 500 MG 24 hr tablet Take 1 tablet (500 mg total) by mouth 2 (two) times daily after a meal.  . Multiple Vitamin (MULTIVITAMIN) tablet Take 3 tablets by mouth daily.   . pantoprazole (PROTONIX) 40 MG tablet TAKE 1 TABLET BY MOUTH EVERY DAY,  . rosuvastatin (CRESTOR) 20 MG tablet Take 1 tablet (20 mg total) by mouth at bedtime.  . Zinc 25 MG TABS Take 1 tablet by mouth daily.  . [DISCONTINUED] Cholecalciferol (VITAMIN D3) 5000 units CAPS Take 1 capsule by mouth daily.  . [DISCONTINUED] metFORMIN (GLUCOPHAGE-XR) 500 MG 24 hr tablet TAKE 1 TABLET(500 MG) BY MOUTH DAILY WITH BREAKFAST  . [DISCONTINUED] rosuvastatin (CRESTOR) 20 MG tablet Take 1 tablet (20 mg total) by mouth at bedtime.   No facility-administered encounter medications on file as of 11/09/2020.    ALLERGIES: Allergies  Allergen Reactions  . Erythromycin Anaphylaxis    Patient allergic to all of the mycin medications    VACCINATION STATUS:  There is no immunization history on file for this patient.  Diabetes She presents for her follow-up diabetic visit. She has type 2 diabetes mellitus. Onset time:  She was diagnosed at approximate age of 55 years. Her disease course has been improving. There are no hypoglycemic associated symptoms. Pertinent negatives for hypoglycemia include no confusion, headaches, pallor or seizures. There are no diabetic associated symptoms. Pertinent negatives for diabetes include no chest pain, no polydipsia, no polyphagia and no polyuria. There are no hypoglycemic complications. Symptoms are improving. There are no diabetic complications. Risk factors for coronary artery disease include diabetes mellitus and dyslipidemia. When asked about current treatments, none were reported. Her weight is decreasing steadily. She is following a generally unhealthy diet. When asked about meal planning, she reported none. She has not had a previous visit with a dietitian. She participates in exercise intermittently. (She presents with moving glycemic profile with A1c of 7.6% at point-of-care today.   Refill her low-dose metformin.  ) An ACE inhibitor/angiotensin II receptor blocker is not being taken. Eye exam is current.  Hyperlipidemia This is a chronic problem. The current episode started more than 1 year ago. Exacerbating diseases include diabetes. Pertinent negatives include no chest pain, myalgias or shortness of breath. Current antihyperlipidemic treatment includes statins. Risk factors for coronary artery disease include dyslipidemia and diabetes mellitus.    Review of Systems  Constitutional: Negative for chills, fever and unexpected weight change.  HENT: Negative for trouble swallowing and voice change.   Eyes: Negative for visual disturbance.  Respiratory: Negative for cough, shortness of breath and wheezing.   Cardiovascular: Negative for chest pain, palpitations and leg swelling.  Gastrointestinal: Negative for diarrhea, nausea and vomiting.  Endocrine: Negative for cold intolerance, heat intolerance, polydipsia, polyphagia and polyuria.  Musculoskeletal: Negative for  arthralgias and myalgias.  Skin: Negative for color change, pallor, rash and wound.  Neurological: Negative for seizures and headaches.  Psychiatric/Behavioral: Negative for confusion and suicidal ideas.    Objective:    BP 133/81   Pulse 88   Ht 5\' 8"  (1.727 m)   Wt 173 lb (78.5 kg)   BMI 26.30 kg/m   Wt Readings from Last 3 Encounters:  11/09/20 173 lb (78.5 kg)  05/03/20 178 lb 6.4 oz (80.9 kg)  05/01/20 179 lb (81.2 kg)     Physical Exam Constitutional:      Appearance: She is well-developed.  HENT:     Head: Normocephalic and atraumatic.  Neck:     Thyroid: No  thyromegaly.     Trachea: No tracheal deviation.  Cardiovascular:     Rate and Rhythm: Normal rate.  Pulmonary:     Effort: Pulmonary effort is normal.  Abdominal:     Tenderness: There is no abdominal tenderness. There is no guarding.  Musculoskeletal:        General: Normal range of motion.     Cervical back: Normal range of motion and neck supple.  Skin:    General: Skin is warm and dry.     Coloration: Skin is not pale.     Findings: No erythema or rash.  Neurological:     Mental Status: She is alert and oriented to person, place, and time.     Cranial Nerves: No cranial nerve deficit.     Coordination: Coordination normal.     Deep Tendon Reflexes: Reflexes are normal and symmetric.  Psychiatric:        Judgment: Judgment normal.     CMP ( most recent) CMP     Component Value Date/Time   NA 140 10/27/2020 0000   K 4.2 10/27/2020 0000   CL 103 10/27/2020 0000   CO2 21 10/27/2020 0000   GLUCOSE 105 (H) 03/15/2012 1715   BUN 15 10/27/2020 0000   CREATININE 0.7 10/27/2020 0000   CREATININE 0.61 03/15/2012 1715   CALCIUM 9.9 10/27/2020 0000   ALBUMIN 4.7 10/27/2020 0000   AST 18 10/27/2020 0000   ALT 27 10/27/2020 0000   ALKPHOS 112 10/27/2020 0000   GFRNONAA 81 04/05/2020 0000   GFRAA 93 04/05/2020 0000    Diabetic Labs (most recent): Lab Results  Component Value Date   HGBA1C 7.6  10/27/2020   HGBA1C 8.1 (A) 05/03/2020   HGBA1C 7.4 (A) 09/02/2019    Lab Results  Component Value Date   TSH 1.30 10/27/2020   TSH 2.88 04/05/2020   TSH 1.85 01/06/2019    Lipid Panel     Component Value Date/Time   CHOL 179 10/27/2020 0000   TRIG 124 10/27/2020 0000   HDL 48 10/27/2020 0000   LDLCALC 109 10/27/2020 0000     Assessment & Plan:   1. Uncontrolled type 2 diabetes mellitus with hyperglycemia ([place])  - Mackenzye R Spalla has currently uncontrolled symptomatic type 2 DM since  55 years of age.  She presents with moving glycemic profile with A1c of 7.6% at point-of-care today.   Refill her low-dose metformin.    - Recent labs reviewed. - I had a long discussion with her about the progressive nature of diabetes and the pathology behind its complications. -She does not report any complications, however,  she remains at a high risk for acute and chronic complications which include CAD, CVA, CKD, retinopathy, and neuropathy. These are all discussed in detail with her.  - I have counseled her on diet  and weight management  by adopting a carbohydrate restricted/protein rich diet. Patient is encouraged to switch to  unprocessed or minimally processed     complex starch and increased protein intake (animal or plant source), fruits, and vegetables. -  she is advised to stick to a routine mealtimes to eat 3 meals  a day and avoid unnecessary snacks ( to snack only to correct hypoglycemia).   - Suggestion is made for her to avoid simple carbohydrates  from her diet including Cakes, Sweet Desserts, Ice Cream, Soda (diet and regular), Sweet Tea, Candies, Chips, Cookies, Store Bought Juices, Alcohol in Excess of  1-2 drinks a day, Artificial Sweeteners,  Coffee Creamer, and "Carolyn-free" Products, Lemonade. This will help patient to have more stable blood glucose profile and potentially avoid unintended weight gain.   - I have approached her with the following individualized plan to  manage  her diabetes and patient agrees:   -She approached with options to achieve better control of diabetes in light of her A1c at 7.6%.  She agrees with increasing metformin to 500 mg p.o. twice daily over options of Trulicity and glipizide.   - Specific targets for  A1c;  LDL, HDL, Triglycerides, were discussed with the patient.  2) Blood Pressure /Hypertension: Her blood pressure is controlled to target.  She is not on antihypertensive medications currently.  3) Lipids/Hyperlipidemia: Her previsit lipid panel showed improved LDL at 109, improving from 135.  She has tolerated Crestor.  She is advised to continue Crestor 20 mg p.o. nightly.  Side effects and precautions discussed with her.     4)  Weight/Diet:  Body mass index is 26.3 kg/m.  -   she is  a candidate for some  weight loss.  She is advised on exercise, and detailed carbohydrates information provided  -  detailed on discharge instructions.   5) vitamin D deficiency: She is now vitamin D replete at 58.  She is advised to discontinue her supplements until next measurement before next visit in 6 months.   6) Chronic Care/Health Maintenance:  -she  Is  on Statin medications and  is encouraged to initiate and continue to follow up with Ophthalmology, Dentist,  Podiatrist at least yearly or according to recommendations, and advised to  stay away from smoking. I have recommended yearly flu vaccine and pneumonia vaccine at least every 5 years; moderate intensity exercise for up to 150 minutes weekly; and  sleep for at least 7 hours a day.    - she is  advised to maintain close follow up with Marquis Buggy, FNP for primary care needs, as well as her other providers for optimal and coordinated care.   I spent 35 minutes in the care of the patient today including review of labs from [facility], Lipids, Thyroid Function, Hematology (current and previous including abstractions from other facilities); face-to-face time discussing  her blood  glucose readings/logs, discussing hypoglycemia and hyperglycemia episodes and symptoms, medications doses, her options of short and long term treatment based on the latest standards of care / guidelines;  discussion about incorporating lifestyle medicine;  and documenting the encounter.    Please refer to Patient Instructions for Blood Glucose Monitoring and Insulin/Medications Dosing Guide"  in media tab for additional information. Please  also refer to " Patient Self Inventory" in the Media  tab for reviewed elements of pertinent patient history.  [place] participated in the discussions, expressed understanding, and voiced agreement with the above plans.  All questions were answered to her satisfaction. she is encouraged to contact clinic should she have any questions or concerns prior to her return visit.    Follow up plan: - Return in about 6 months (around 05/12/2021) for F/U with Pre-visit Labs, A1c -NV, ABI in Office NV.  Glade Lloyd, MD [facility] [address] [place], [place] [address] Phone: [phone]  Fax: [phone]    11/09/2020, 9:41 PM  This note was partially dictated with voice recognition software. Similar sounding words can be transcribed inadequately or may not  be corrected upon review.

## 2021-04-18 LAB — LIPID PANEL
Cholesterol: 239 — AB (ref 0–200)
HDL: 44 (ref 35–70)
LDL Cholesterol: 176
Triglycerides: 106 (ref 40–160)

## 2021-04-18 LAB — TSH: TSH: 0.47 (ref 0.41–5.90)

## 2021-04-18 LAB — BASIC METABOLIC PANEL
BUN: 13 (ref 4–21)
CO2: 23 — AB (ref 13–22)
Chloride: 100 (ref 99–108)
Creatinine: 0.7 (ref 0.5–1.1)
Glucose: 144
Potassium: 4.1 (ref 3.4–5.3)
Sodium: 139 (ref 137–147)

## 2021-04-18 LAB — HEPATIC FUNCTION PANEL
ALT: 20 (ref 7–35)
AST: 17 (ref 13–35)
Alkaline Phosphatase: 136 — AB (ref 25–125)
Bilirubin, Total: 0.3

## 2021-04-18 LAB — COMPREHENSIVE METABOLIC PANEL
Albumin: 4.4 (ref 3.5–5.0)
Calcium: 9.6 (ref 8.7–10.7)
Globulin: 2.6

## 2021-04-18 LAB — HEMOGLOBIN A1C: Hemoglobin A1C: 7.8

## 2021-05-16 ENCOUNTER — Telehealth: Payer: Self-pay

## 2021-05-16 NOTE — Telephone Encounter (Signed)
Patient called and stated that her OBGYN Kathyrn Sheriff NP in [place] was going to fax her most recent labs to [place] from 04/18/2021. [phone]

## 2021-05-23 ENCOUNTER — Encounter: Payer: Self-pay | Admitting: "Endocrinology

## 2021-05-23 ENCOUNTER — Ambulatory Visit (INDEPENDENT_AMBULATORY_CARE_PROVIDER_SITE_OTHER): Payer: BC Managed Care – PPO | Admitting: "Endocrinology

## 2021-05-23 ENCOUNTER — Other Ambulatory Visit: Payer: Self-pay

## 2021-05-23 VITALS — BP 134/82 | HR 108 | Ht 68.0 in | Wt 163.2 lb

## 2021-05-23 DIAGNOSIS — E782 Mixed hyperlipidemia: Secondary | ICD-10-CM

## 2021-05-23 DIAGNOSIS — E1165 Type 2 diabetes mellitus with hyperglycemia: Secondary | ICD-10-CM | POA: Diagnosis not present

## 2021-05-23 NOTE — Patient Instructions (Signed)

## 2021-05-23 NOTE — Progress Notes (Signed)
05/23/2021, 12:37 PM  Endocrinology follow-up note   Subjective:    Patient ID: Carolyn Porter, female    DOB: April 29, 1966.  Carolyn Porter is being seen in follow-up after she was seen in consultation for management of currently uncontrolled symptomatic diabetes requested by  Marquis Buggy, [place].   Past Medical History:  Diagnosis Date   Allergy    Arthritis    Breast cancer ([place])    Colon polyp    Gastric irritation    GERD (gastroesophageal reflux disease)    Hyperlipidemia    Internal hemorrhoid     Past Surgical History:  Procedure Laterality Date   ABDOMINAL HYSTERECTOMY     BREAST LUMPECTOMY     COLONOSCOPY     portacath removal     RECTAL POLYPECTOMY      Social History   Socioeconomic History   Marital status: Married    Spouse name: Not on file   Number of children: 2   Years of education: Not on file   Highest education level: Not on file  Occupational History   Occupation: RN  Tobacco Use   Smoking status: Never   Smokeless tobacco: Never  Substance and Sexual Activity   Alcohol use: Yes    Alcohol/week: 0.0 standard drinks    Comment: occ   Drug use: No   Sexual activity: Yes    Birth control/protection: Surgical  Other Topics Concern   Not on file  Social History Narrative   Not on file   Social Determinants of Health   Financial Resource Strain: Not on file  Food Insecurity: Not on file  Transportation Needs: Not on file  Physical Activity: Not on file  Stress: Not on file  Social Connections: Not on file    Family History  Problem Relation Age of Onset   Diabetes Mother    Diabetes Maternal Grandmother    Colon cancer Neg Hx    Esophageal cancer Neg Hx    Rectal cancer Neg Hx    Stomach cancer Neg Hx     Outpatient Encounter Medications as of 05/23/2021  Medication Sig   b complex vitamins capsule Take 1 capsule by mouth daily.   Ascorbic Acid  (VITAMIN C) 250 MG CHEW Chew 3 tablets by mouth daily.   aspirin EC 81 MG tablet Take 81 mg by mouth daily.   co-enzyme Q-10 30 MG capsule Take 30 mg by mouth daily.    famotidine (PEPCID) 20 MG tablet Take 1 tablet (20 mg total) by mouth at bedtime.   fluticasone (FLONASE) 50 MCG/ACT nasal spray Place 2 sprays into both nostrils as needed.    glucose blood test strip 1 each by Other route 4 (four) times daily. Use as instructed qid E11.65 AccuChek Guide   Lancets Ultra Fine MISC 1 each by Other route 2 (two) times daily. Test BG bid. E11.65 AccuChek Guide   loperamide (IMODIUM A-D) 2 MG tablet Take 2 mg by mouth as needed for diarrhea or loose stools.   metFORMIN (GLUCOPHAGE-XR) 500 MG 24 hr tablet Take 1 tablet (500 mg total) by mouth 2 (two) times daily after a meal.   Multiple Vitamin (MULTIVITAMIN)  tablet Take 3 tablets by mouth daily.    pantoprazole (PROTONIX) 40 MG tablet TAKE 1 TABLET BY MOUTH EVERY DAY,   rosuvastatin (CRESTOR) 20 MG tablet Take 1 tablet (20 mg total) by mouth at bedtime.   Zinc 25 MG TABS Take 1 tablet by mouth daily.   [DISCONTINUED] budesonide (ENTOCORT EC) 3 MG 24 hr capsule Take 3 capsules (9 mg total) by mouth daily. Take 3 daily for 4 weeks, take 2 daily for 4 weeks, take 1 daily for 4 weeks   No facility-administered encounter medications on file as of 05/23/2021.    ALLERGIES: Allergies  Allergen Reactions   Erythromycin Anaphylaxis    Patient allergic to all of the mycin medications    VACCINATION STATUS:  There is no immunization history on file for this patient.  Diabetes She presents for her follow-up diabetic visit. She has type 2 diabetes mellitus. Onset time: She was diagnosed at approximate age of 11 years. Her disease course has been worsening. There are no hypoglycemic associated symptoms. Pertinent negatives for hypoglycemia include no confusion, headaches, pallor or seizures. There are no diabetic associated symptoms. Pertinent negatives  for diabetes include no chest pain, no polydipsia, no polyphagia and no polyuria. There are no hypoglycemic complications. Symptoms are worsening. There are no diabetic complications. Risk factors for coronary artery disease include diabetes mellitus and dyslipidemia. When asked about current treatments, none were reported. Her weight is decreasing steadily. She is following a generally unhealthy diet. When asked about meal planning, she reported none. She has not had a previous visit with a dietitian. She participates in exercise intermittently. (She presents with unchanged glycemic profile with A1c of 7.8%.  Mostly, she is using metformin only once a day 500 mg.  ) An ACE inhibitor/angiotensin II receptor blocker is not being taken. Eye exam is current.  Hyperlipidemia This is a chronic problem. The current episode started more than 1 year ago. The problem is uncontrolled. Exacerbating diseases include diabetes. Pertinent negatives include no chest pain, myalgias or shortness of breath. Current antihyperlipidemic treatment includes statins. Risk factors for coronary artery disease include dyslipidemia and diabetes mellitus.   Review of Systems  Constitutional:  Negative for chills, fever and unexpected weight change.  HENT:  Negative for trouble swallowing and voice change.   Eyes:  Negative for visual disturbance.  Respiratory:  Negative for cough, shortness of breath and wheezing.   Cardiovascular:  Negative for chest pain, palpitations and leg swelling.  Gastrointestinal:  Negative for diarrhea, nausea and vomiting.  Endocrine: Negative for cold intolerance, heat intolerance, polydipsia, polyphagia and polyuria.  Musculoskeletal:  Negative for arthralgias and myalgias.  Skin:  Negative for color change, pallor, rash and wound.  Neurological:  Negative for seizures and headaches.  Psychiatric/Behavioral:  Negative for confusion and suicidal ideas.    Objective:    BP 134/82   Pulse (!) 108    Ht 5\' 8"  (1.727 m)   Wt 163 lb 3.2 oz (74 kg)   BMI 24.81 kg/m   Wt Readings from Last 3 Encounters:  05/23/21 163 lb 3.2 oz (74 kg)  11/09/20 173 lb (78.5 kg)  05/03/20 178 lb 6.4 oz (80.9 kg)     Physical Exam Constitutional:      Appearance: She is well-developed.  HENT:     Head: Normocephalic and atraumatic.  Neck:     Thyroid: No thyromegaly.     Trachea: No tracheal deviation.  Cardiovascular:     Rate and Rhythm: Normal rate.  Pulmonary:     Effort: Pulmonary effort is normal.  Abdominal:     Tenderness: There is no abdominal tenderness. There is no guarding.  Musculoskeletal:        General: Normal range of motion.     Cervical back: Normal range of motion and neck supple.  Skin:    General: Skin is warm and dry.     Coloration: Skin is not pale.     Findings: No erythema or rash.  Neurological:     Mental Status: She is alert and oriented to person, place, and time.     Cranial Nerves: No cranial nerve deficit.     Coordination: Coordination normal.     Deep Tendon Reflexes: Reflexes are normal and symmetric.  Psychiatric:        Judgment: Judgment normal.    CMP ( most recent) CMP     Component Value Date/Time   NA 139 04/18/2021 0000   K 4.1 04/18/2021 0000   CL 100 04/18/2021 0000   CO2 23 (A) 04/18/2021 0000   GLUCOSE 105 (H) 03/15/2012 1715   BUN 13 04/18/2021 0000   CREATININE 0.7 04/18/2021 0000   CREATININE 0.61 03/15/2012 1715   CALCIUM 9.6 04/18/2021 0000   ALBUMIN 4.4 04/18/2021 0000   AST 17 04/18/2021 0000   ALT 20 04/18/2021 0000   ALKPHOS 136 (A) 04/18/2021 0000   GFRNONAA 81 04/05/2020 0000   GFRAA 93 04/05/2020 0000    Diabetic Labs (most recent): Lab Results  Component Value Date   HGBA1C 7.8 04/18/2021   HGBA1C 7.6 10/28/2020   HGBA1C 8.1 (A) 05/03/2020    Lab Results  Component Value Date   TSH 0.47 04/18/2021   TSH 1.30 10/27/2020   TSH 2.88 04/05/2020   TSH 1.85 01/06/2019    Lipid Panel     Component Value  Date/Time   CHOL 239 (A) 04/18/2021 0000   TRIG 106 04/18/2021 0000   HDL 44 04/18/2021 0000   LDLCALC 176 04/18/2021 0000     Assessment & Plan:   1. Uncontrolled type 2 diabetes mellitus with hyperglycemia ([place])  - Tinleigh R Teixeira has currently uncontrolled symptomatic type 2 DM since  55 years of age.  She presents with worsening glycemic profile with A1c of 7.8%.   - Recent labs reviewed. - I had a long discussion with her about the progressive nature of diabetes and the pathology behind its complications. -She does not report any complications, however,  she remains at a high risk for acute and chronic complications which include CAD, CVA, CKD, retinopathy, and neuropathy. These are all discussed in detail with her.  - I have counseled her on diet  and weight management  by adopting a carbohydrate restricted/protein rich diet. Patient is encouraged to switch to  unprocessed or minimally processed     complex starch and increased protein intake (mainly plant source), fruits, and vegetables. -  she is advised to stick to a routine mealtimes to eat 3 meals  a day and avoid unnecessary snacks ( to snack only to correct hypoglycemia).   - Suggestion is made for her to avoid simple carbohydrates  from her diet including Cakes, Sweet Desserts, Ice Cream, Soda (diet and regular), Sweet Tea, Candies, Chips, Cookies, Store Bought Juices, Alcohol in Excess of  1-2 drinks a day, Artificial Sweeteners,  Coffee Creamer, and "Sugar-free" Products, Lemonade. This will help patient to have more stable blood glucose profile and potentially avoid unintended weight gain.   -  I have approached her with the following individualized plan to manage  her diabetes and patient agrees:   -I had a long discussion about lifestyle medicine with her.  Whole Foods plant-based lifestyle nutrition will improve her diabetes, hyperlipidemia, and helps with weight management.    She is advised to continue metformin at  1000 mg ER once daily if tolerated, otherwise at her lower dose of 500 mg ER p.o. daily at breakfast.   -She has the option of going on Trulicity or glipizide if A1c continues to increase during her next visit.    - Specific targets for  A1c;  LDL, HDL, Triglycerides, were discussed with the patient.  2) Blood Pressure /Hypertension: Her blood pressure is controlled to target.  She is not on antihypertensive medications currently.  3) Lipids/Hyperlipidemia: Her previsit lipid panel showed improved LDL at 176 increasing from 109.  She has tolerated Crestor 20 mg p.o. daily.  She is advised to continue.  The above diet plan discussed with her is mainly for dyslipidemia.  She is advised to switch to 90 to 95% plant-based protein until next measurement.      4)  Weight/Diet:  Body mass index is 24.81 kg/m.  -   she is not a candidate for  weight loss.  She is advised on exercise, and detailed carbohydrates information provided  -  detailed on discharge instructions.   5) vitamin D deficiency: She is now vitamin D replete at 56.  She is advised to discontinue her supplements until next measurement before next visit in 6 months.   6) Chronic Care/Health Maintenance:  -she  Is  on Statin medications and  is encouraged to initiate and continue to follow up with Ophthalmology, Dentist,  Podiatrist at least yearly or according to recommendations, and advised to  stay away from smoking. I have recommended yearly flu vaccine and pneumonia vaccine at least every 5 years; moderate intensity exercise for up to 150 minutes weekly; and  sleep for at least 7 hours a day.    - she is  advised to maintain close follow up with Marquis Buggy, FNP for primary care needs, as well as her other providers for optimal and coordinated care.   I spent 41 minutes in the care of the patient today including review of labs from [place], Lipids, Thyroid Function, Hematology (current and previous including abstractions from  other facilities); face-to-face time discussing  her blood glucose readings/logs, discussing hypoglycemia and hyperglycemia episodes and symptoms, medications doses, her options of short and long term treatment based on the latest standards of care / guidelines;  discussion about incorporating lifestyle medicine;  and documenting the encounter.    Please refer to Patient Instructions for Blood Glucose Monitoring and Insulin/Medications Dosing Guide"  in media tab for additional information. Please  also refer to " Patient Self Inventory" in the Media  tab for reviewed elements of pertinent patient history.  [place] participated in the discussions, expressed understanding, and voiced agreement with the above plans.  All questions were answered to her satisfaction. she is encouraged to contact clinic should she have any questions or concerns prior to her return visit.    Follow up plan: - Return in about 3 months (around 08/21/2021) for F/U with Pre-visit Labs, A1c -NV.  Glade Lloyd, MD [facility] [address] [place], [place] [address] Phone: [phone]  Fax: [phone]    05/23/2021, 12:37 PM  This note was partially dictated with  voice recognition software. Similar sounding words can be transcribed inadequately or may not  be corrected upon review.

## 2021-07-15 ENCOUNTER — Other Ambulatory Visit: Payer: Self-pay | Admitting: Internal Medicine

## 2021-07-16 ENCOUNTER — Other Ambulatory Visit: Payer: Self-pay | Admitting: "Endocrinology

## 2021-07-25 ENCOUNTER — Other Ambulatory Visit: Payer: Self-pay | Admitting: "Endocrinology

## 2021-08-20 LAB — LIPID PANEL
Cholesterol: 191 (ref 0–200)
HDL: 48 (ref 35–70)
LDL Cholesterol: 120
Triglycerides: 127 (ref 40–160)

## 2021-08-23 ENCOUNTER — Ambulatory Visit (INDEPENDENT_AMBULATORY_CARE_PROVIDER_SITE_OTHER): Payer: BC Managed Care – PPO | Admitting: "Endocrinology

## 2021-08-23 ENCOUNTER — Encounter: Payer: Self-pay | Admitting: "Endocrinology

## 2021-08-23 ENCOUNTER — Other Ambulatory Visit: Payer: Self-pay

## 2021-08-23 VITALS — BP 101/69 | HR 76 | Ht 68.5 in | Wt 161.6 lb

## 2021-08-23 DIAGNOSIS — E782 Mixed hyperlipidemia: Secondary | ICD-10-CM | POA: Diagnosis not present

## 2021-08-23 DIAGNOSIS — E1165 Type 2 diabetes mellitus with hyperglycemia: Secondary | ICD-10-CM | POA: Diagnosis not present

## 2021-08-23 LAB — POCT GLYCOSYLATED HEMOGLOBIN (HGB A1C): HbA1c, POC (controlled diabetic range): 7.2 % — AB (ref 0.0–7.0)

## 2021-08-23 NOTE — Patient Instructions (Signed)

## 2021-08-23 NOTE — Progress Notes (Signed)
08/23/2021, 6:27 PM  Endocrinology follow-up note   Subjective:    Patient ID: Carolyn Porter, female    DOB: Oct 06, 1965.  Azalyn R Beshara is being seen in follow-up after she was seen in consultation for management of currently uncontrolled symptomatic diabetes requested by  Marquis Buggy, [place].   Past Medical History:  Diagnosis Date   Allergy    Arthritis    Breast cancer ([place])    Colon polyp    Gastric irritation    GERD (gastroesophageal reflux disease)    Hyperlipidemia    Internal hemorrhoid     Past Surgical History:  Procedure Laterality Date   ABDOMINAL HYSTERECTOMY     BREAST LUMPECTOMY     COLONOSCOPY     portacath removal     RECTAL POLYPECTOMY      Social History   Socioeconomic History   Marital status: Married    Spouse name: Not on file   Number of children: 2   Years of education: Not on file   Highest education level: Not on file  Occupational History   Occupation: RN  Tobacco Use   Smoking status: Never   Smokeless tobacco: Never  Substance and Sexual Activity   Alcohol use: Yes    Alcohol/week: 0.0 standard drinks    Comment: occ   Drug use: No   Sexual activity: Yes    Birth control/protection: Surgical  Other Topics Concern   Not on file  Social History Narrative   Not on file   Social Determinants of Health   Financial Resource Strain: Not on file  Food Insecurity: Not on file  Transportation Needs: Not on file  Physical Activity: Not on file  Stress: Not on file  Social Connections: Not on file    Family History  Problem Relation Age of Onset   Diabetes Mother    Diabetes Maternal Grandmother    Colon cancer Neg Hx    Esophageal cancer Neg Hx    Rectal cancer Neg Hx    Stomach cancer Neg Hx     Outpatient Encounter Medications as of 08/23/2021  Medication Sig   Ascorbic Acid (VITAMIN C) 250 MG CHEW Chew 1-2 tablets by mouth daily.   aspirin EC  81 MG tablet Take 81 mg by mouth daily.   b complex vitamins capsule Take 1 capsule by mouth daily.   co-enzyme Q-10 30 MG capsule Take 30 mg by mouth daily.    famotidine (PEPCID) 20 MG tablet Take 1 tablet (20 mg total) by mouth at bedtime.   fluticasone (FLONASE) 50 MCG/ACT nasal spray Place 2 sprays into both nostrils as needed.    glucose blood test strip 1 each by Other route 4 (four) times daily. Use as instructed qid E11.65 AccuChek Guide   Lancets Ultra Fine MISC 1 each by Other route 2 (two) times daily. Test BG bid. E11.65 AccuChek Guide   loperamide (IMODIUM A-D) 2 MG tablet Take 2 mg by mouth as needed for diarrhea or loose stools.   metFORMIN (GLUCOPHAGE-XR) 500 MG 24 hr tablet TAKE 1 TABLET(500 MG) BY MOUTH TWICE DAILY AFTER A MEAL   Multiple Vitamin (MULTIVITAMIN) tablet Take 4 tablets  by mouth daily.   pantoprazole (PROTONIX) 40 MG tablet TAKE 1 TABLET BY MOUTH EVERY DAY,  Office visit for further refills   rosuvastatin (CRESTOR) 20 MG tablet TAKE 1 TABLET(20 MG) BY MOUTH AT BEDTIME   Zinc 25 MG TABS Take 1 tablet by mouth daily.   No facility-administered encounter medications on file as of 08/23/2021.    ALLERGIES: Allergies  Allergen Reactions   Erythromycin Anaphylaxis    Patient allergic to all of the mycin medications    VACCINATION STATUS:  There is no immunization history on file for this patient.  Diabetes She presents for her follow-up diabetic visit. She has type 2 diabetes mellitus. Onset time: She was diagnosed at approximate age of 56 years. Her disease course has been improving. There are no hypoglycemic associated symptoms. Pertinent negatives for hypoglycemia include no confusion, headaches, pallor or seizures. There are no diabetic associated symptoms. Pertinent negatives for diabetes include no chest pain, no polydipsia, no polyphagia and no polyuria. There are no hypoglycemic complications. Symptoms are improving. There are no diabetic complications.  Risk factors for coronary artery disease include diabetes mellitus and dyslipidemia. When asked about current treatments, none were reported. Her weight is fluctuating minimally. She is following a generally unhealthy diet. When asked about meal planning, she reported none. She has not had a previous visit with a dietitian. She participates in exercise intermittently. Her home blood glucose trend is decreasing steadily. (She presents with significantly improved glycemic profile with A1c of 7.2% at point-of-care today improving from 7.8%.  She remains on metformin 500 mg p.o. twice daily.  No side effects.   ) An ACE inhibitor/angiotensin II receptor blocker is not being taken. Eye exam is current.  Hyperlipidemia This is a chronic problem. The current episode started more than 1 year ago. The problem is uncontrolled. Exacerbating diseases include diabetes. Pertinent negatives include no chest pain, myalgias or shortness of breath. Current antihyperlipidemic treatment includes statins. Risk factors for coronary artery disease include dyslipidemia and diabetes mellitus.   Review of Systems  Constitutional:  Negative for chills, fever and unexpected weight change.  HENT:  Negative for trouble swallowing and voice change.   Eyes:  Negative for visual disturbance.  Respiratory:  Negative for cough, shortness of breath and wheezing.   Cardiovascular:  Negative for chest pain, palpitations and leg swelling.  Gastrointestinal:  Negative for diarrhea, nausea and vomiting.  Endocrine: Negative for cold intolerance, heat intolerance, polydipsia, polyphagia and polyuria.  Musculoskeletal:  Negative for arthralgias and myalgias.  Skin:  Negative for color change, pallor, rash and wound.  Neurological:  Negative for seizures and headaches.  Psychiatric/Behavioral:  Negative for confusion and suicidal ideas.    Objective:    BP 101/69    Pulse 76    Ht 5' 8.5" (1.74 m)    Wt 161 lb 9.6 oz (73.3 kg)    BMI 24.21  kg/m   Wt Readings from Last 3 Encounters:  08/23/21 161 lb 9.6 oz (73.3 kg)  05/23/21 163 lb 3.2 oz (74 kg)  11/09/20 173 lb (78.5 kg)       CMP ( most recent) CMP     Component Value Date/Time   NA 139 04/18/2021 0000   K 4.1 04/18/2021 0000   CL 100 04/18/2021 0000   CO2 23 (A) 04/18/2021 0000   GLUCOSE 105 (H) 03/15/2012 1715   BUN 13 04/18/2021 0000   CREATININE 0.7 04/18/2021 0000   CREATININE 0.61 03/15/2012 1715   CALCIUM 9.6  04/18/2021 0000   ALBUMIN 4.4 04/18/2021 0000   AST 17 04/18/2021 0000   ALT 20 04/18/2021 0000   ALKPHOS 136 (A) 04/18/2021 0000   GFRNONAA 81 04/05/2020 0000   GFRAA 93 04/05/2020 0000    Diabetic Labs (most recent): Lab Results  Component Value Date   HGBA1C 7.2 (A) 08/23/2021   HGBA1C 7.8 04/18/2021   HGBA1C 7.6 10/28/2020    Lab Results  Component Value Date   TSH 0.47 04/18/2021   TSH 1.30 10/27/2020   TSH 2.88 04/05/2020   TSH 1.85 01/06/2019    Lipid Panel     Component Value Date/Time   CHOL 191 08/20/2021 0000   TRIG 127 08/20/2021 0000   HDL 48 08/20/2021 0000   LDLCALC 120 08/20/2021 0000     Assessment & Plan:   1. Uncontrolled type 2 diabetes mellitus with hyperglycemia ([place])  - Wynema R Rolin has currently uncontrolled symptomatic type 2 DM since  56 years of age.  She presents with significantly improved glycemic profile with A1c of 7.2% at point-of-care today improving from 7.8%.  She remains on metformin 500 mg p.o. twice daily.  No side effects.   - Recent labs reviewed. - I had a long discussion with her about the progressive nature of diabetes and the pathology behind its complications. -She does not report any complications, however,  she remains at a high risk for acute and chronic complications which include CAD, CVA, CKD, retinopathy, and neuropathy. These are all discussed in detail with her.  - I have counseled her on diet  and weight management  by adopting a carbohydrate restricted/protein  rich diet. Patient is encouraged to switch to  unprocessed or minimally processed     complex starch and increased protein intake (mainly plant source), fruits, and vegetables. -  she is advised to stick to a routine mealtimes to eat 3 meals  a day and avoid unnecessary snacks ( to snack only to correct hypoglycemia).   - she acknowledges that there is a room for improvement in her food and drink choices.  This patient will benefit the most from lifestyle medicine. - Suggestion is made for her to avoid simple carbohydrates  from her diet including Cakes, Sweet Desserts, Ice Cream, Soda (diet and regular), Sweet Tea, Candies, Chips, Cookies, Store Bought Juices, Alcohol , Artificial Sweeteners,  Coffee Creamer, and "Sugar-free" Products, Lemonade. This will help patient to have more stable blood glucose profile and potentially avoid unintended weight gain.  The following Lifestyle Medicine recommendations according to [facility]) were discussed and and offered to patient and she  agrees to start the journey:  A. Whole Foods, Plant-Based Nutrition comprising of fruits and vegetables, plant-based proteins, whole-grain carbohydrates was discussed in detail with the patient.   A list for source of those nutrients were also provided to the patient.  Patient will use only water or unsweetened tea for hydration. B.  The need to stay away from risky substances including alcohol, smoking; obtaining 7 to 9 hours of restorative sleep, at least 150 minutes of moderate intensity exercise weekly, the importance of healthy social connections,  and stress management techniques were discussed. C.  A full color page of  Calorie density of various food groups per pound showing examples of each food groups was provided to the patient.    - I have approached her with the following individualized plan to manage  her diabetes and patient agrees:  -In light  of her presentation with  controlled glycemic profile with A1c of 7.2%, she will not need insulin treatment at this time.  She is advised to continue metformin 500 mg ER p.o. twice a day after breakfast and supper.    -She has the option of going on Trulicity or glipizide if A1c continues to increase during her next visit.    - Specific targets for  A1c;  LDL, HDL, Triglycerides, were discussed with the patient.  2) Blood Pressure /Hypertension:  Her blood pressure is controlled to target.  She is not on antihypertensive medications currently.  3) Lipids/Hyperlipidemia: Her previsit lipid panel showed significant improvement in her LDL to 120 from 176, at 33% drop.  She is engaged in tolerating Crestor 20 mg p.o. daily at bedtime.  She would benefit even more from lifestyle medicine, see above.     4)  Weight/Diet:  Body mass index is 24.21 kg/m.  -   she is not a candidate for  weight loss.  She is advised on exercise, and detailed carbohydrates information provided  -  detailed on discharge instructions.   5) vitamin D deficiency: She is now vitamin D replete at 67.  She is advised to discontinue her supplements until next measurement before next visit in 6 months.   6) Chronic Care/Health Maintenance:  -she  Is  on Statin medications and  is encouraged to initiate and continue to follow up with Ophthalmology, Dentist,  Podiatrist at least yearly or according to recommendations, and advised to  stay away from smoking. I have recommended yearly flu vaccine and pneumonia vaccine at least every 5 years; moderate intensity exercise for up to 150 minutes weekly; and  sleep for at least 7 hours a day.    - she is  advised to maintain close follow up with Marquis Buggy, FNP for primary care needs, as well as her other providers for optimal and coordinated care.   I spent 34 minutes in the care of the patient today including review of labs from Thyroid Function, CMP, and other relevant labs ; imaging/biopsy records  (current and previous including abstractions from other facilities); face-to-face time discussing  her lab results and symptoms, medications doses, her options of short and long term treatment based on the latest standards of care / guidelines;   and documenting the encounter.  [place]  participated in the discussions, expressed understanding, and voiced agreement with the above plans.  All questions were answered to her satisfaction. she is encouraged to contact clinic should she have any questions or concerns prior to her return visit.     Follow up plan: - Return in about 6 months (around 02/23/2022) for F/U with Pre-visit Labs, A1c -NV.  Glade Lloyd, MD [facility] [address] [place], [place] [address] Phone: [phone]  Fax: [phone]    08/23/2021, 6:27 PM  This note was partially dictated with voice recognition software. Similar sounding words can be transcribed inadequately or may not  be corrected upon review.

## 2021-08-27 ENCOUNTER — Encounter: Payer: Self-pay | Admitting: Internal Medicine

## 2021-08-27 ENCOUNTER — Ambulatory Visit (INDEPENDENT_AMBULATORY_CARE_PROVIDER_SITE_OTHER): Payer: BC Managed Care – PPO | Admitting: Internal Medicine

## 2021-08-27 VITALS — BP 120/70 | HR 102 | Ht 69.0 in | Wt 166.0 lb

## 2021-08-27 DIAGNOSIS — K219 Gastro-esophageal reflux disease without esophagitis: Secondary | ICD-10-CM

## 2021-08-27 DIAGNOSIS — K52832 Lymphocytic colitis: Secondary | ICD-10-CM | POA: Diagnosis not present

## 2021-08-27 MED ORDER — FAMOTIDINE 20 MG PO TABS: 20.0000 mg | ORAL_TABLET | Freq: Every day | ORAL | 3 refills | Status: AC

## 2021-08-27 MED ORDER — PANTOPRAZOLE SODIUM 40 MG PO TBEC: DELAYED_RELEASE_TABLET | ORAL | 3 refills | Status: DC

## 2021-08-27 NOTE — Progress Notes (Signed)
HISTORY OF PRESENT ILLNESS: ? 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  She was last evaluated May 01, 2020.  See that dictation.  At that time her lymphocytic colitis was improved on a course of budesonide.  For GERD she was maintained on a combination of daily PPI and at night H2 receptor antagonist therapy.  Her last colonoscopy was performed September 2021.  The examination revealed mild left-sided colitis but was otherwise normal including intubation of the ileum.  Biopsies revealed lymphocytic colitis.  At the time of her last visit she was to taper off of budesonide which she did.  She tells me that her bowel habits have improved.  Currently 3-4 bowel movements per day.  Formed half of the time.  She takes Imodium as needed.  She did notice somewhat worsening loose bowels was started on metformin.  She is pleased to report weight loss and improved hemoglobin A1c and cholesterol levels.  She takes Protonix every morning.  She request a refill.  She takes Pepcid at night as needed.  No dysphagia.  No new complaints.  She does have some questions regarding insurance coverage for endoscopic procedures ? ?REVIEW OF SYSTEMS: ? ?All non-GI ROS negative unless otherwise stated in the HPI except for fatigue, back pain, sinus and allergies ? ?Past Medical History:  ?Diagnosis Date  ? Allergy   ? Arthritis   ? Breast cancer ([place])   ? Colon polyp   ? Gastric irritation   ? GERD (gastroesophageal reflux disease)   ? Hyperlipidemia   ? Internal hemorrhoid   ? ? ?Past Surgical History:  ?Procedure Laterality Date  ? ABDOMINAL HYSTERECTOMY    ? BREAST LUMPECTOMY    ? COLONOSCOPY    ? portacath removal    ? RECTAL POLYPECTOMY    ? ? ?Social History ?Carolyn Porter  reports that she has never smoked. She has never used smokeless tobacco. She reports current alcohol use. She reports that  she does not use drugs. ? ?family history includes Diabetes in her maternal grandmother and mother. ? ?Allergies  ?Allergen Reactions  ? Erythromycin Anaphylaxis  ?  Patient allergic to all of the mycin medications  ? ? ?  ? ?PHYSICAL EXAMINATION: ?Vital signs: BP 120/70   Pulse (!) 102   Ht '5\' 9"'$  (1.753 m)   Wt 166 lb (75.3 kg)   BMI 24.51 kg/m?   ?Constitutional: generally well-appearing, no acute distress ?Psychiatric: alert and oriented x3, cooperative ?Eyes: extraocular movements intact, anicteric, conjunctiva pink ?Mouth: Mask ?Neck: supple no lymphadenopathy ?Cardiovascular: heart regular rate and rhythm, no murmur ?Lungs: clear to auscultation bilaterally ?Abdomen: soft, nontender, nondistended, no obvious ascites, no peritoneal signs, normal bowel sounds, no organomegaly ?Rectal: Omitted ?Extremities: no clubbing, cyanosis, or lower extremity edema bilaterally ?Skin: no lesions on visible extremities ?Neuro: No focal deficits.  Cranial nerves intact ? ?ASSESSMENT: ? ?1.  History of lymphocytic colitis.  Currently off medical therapy.  Bowel habits are reported as acceptable with 3 or 4 mostly formed bowel movements daily.  Using Imodium as needed ?2.  GERD.  On pantoprazole and H2 receptor antagonist therapy as described ?3.  Colonoscopy September 2021 as described ? ? ?PLAN: ? ?1.  Reflux precautions ?2.  Refill pantoprazole 40 mg daily.  Medication risks reviewed ?3.  Refill Pepcid.  Medication risks reviewed ?4.  Imodium as needed ?5.  Routine office  follow-up in 1 year.  Contact the office in the interim for any questions or problems. ?6.  Routine repeat screening colonoscopy 2031 ?Total time of 30 minutes was spent preparing to see the patient, reviewing data, obtaining comprehensive history, performing medically appropriate physical examination, counseling the patient and educating the patient regarding the above listed issues, ordering medications, establishing follow-up interval, and documenting  clinical information in the health record ? ? ? ?  ?

## 2021-08-27 NOTE — Patient Instructions (Signed)
If you are age 57 or older, your body mass index should be between 23-30. Your Body mass index is 24.51 kg/m?Marland Kitchen If this is out of the aforementioned range listed, please consider follow up with your Primary Care Provider. ? ?If you are age 44 or younger, your body mass index should be between 19-25. Your Body mass index is 24.51 kg/m?Marland Kitchen If this is out of the aformentioned range listed, please consider follow up with your Primary Care Provider.  ? ?________________________________________________________ ? ?The [facility] providers would like to encourage you to use [facility] to communicate with providers for non-urgent requests or questions.  Due to long hold times on the telephone, sending your provider a message by [facility] may be a faster and more efficient way to get a response.  Please allow 48 business hours for a response.  Please remember that this is for non-urgent requests.  ?_______________________________________________________ ? ?We have sent the following medications to your pharmacy for you to pick up at your convenience:  Protonix, Pepcid ? ?Please follow up in one year ? ?

## 2021-11-14 ENCOUNTER — Other Ambulatory Visit: Payer: Self-pay | Admitting: "Endocrinology

## 2021-12-03 ENCOUNTER — Other Ambulatory Visit: Payer: Self-pay | Admitting: "Endocrinology

## 2022-02-16 LAB — LIPID PANEL
Chol/HDL Ratio: 4.2 ratio (ref 0.0–4.4)
Cholesterol, Total: 191 mg/dL (ref 100–199)
HDL: 45 mg/dL (ref >39)
LDL Chol Calc (NIH): 124 mg/dL — ABNORMAL HIGH (ref 0–99)
Triglycerides: 124 mg/dL (ref 0–149)
VLDL Cholesterol Cal: 22 mg/dL (ref 5–40)

## 2022-02-16 LAB — COMPREHENSIVE METABOLIC PANEL
ALT: 33 IU/L — ABNORMAL HIGH (ref 0–32)
AST: 21 IU/L (ref 0–40)
Albumin/Globulin Ratio: 1.8 (ref 1.2–2.2)
Albumin: 4.5 g/dL (ref 3.8–4.9)
Alkaline Phosphatase: 111 IU/L (ref 44–121)
BUN/Creatinine Ratio: 21 (ref 9–23)
BUN: 15 mg/dL (ref 6–24)
Bilirubin Total: 0.3 mg/dL (ref 0.0–1.2)
CO2: 22 mmol/L (ref 20–29)
Calcium: 10 mg/dL (ref 8.7–10.2)
Chloride: 105 mmol/L (ref 96–106)
Creatinine, Ser: 0.73 mg/dL (ref 0.57–1.00)
Globulin, Total: 2.5 g/dL (ref 1.5–4.5)
Glucose: 152 mg/dL — ABNORMAL HIGH (ref 70–99)
Potassium: 4.6 mmol/L (ref 3.5–5.2)
Sodium: 142 mmol/L (ref 134–144)
Total Protein: 7 g/dL (ref 6.0–8.5)
eGFR: 96 mL/min/{1.73_m2} (ref >59)

## 2022-02-16 LAB — TSH: TSH: 3.98 u[IU]/mL (ref 0.450–4.500)

## 2022-02-16 LAB — T4, FREE: Free T4: 1.05 ng/dL (ref 0.82–1.77)

## 2022-02-26 ENCOUNTER — Ambulatory Visit (INDEPENDENT_AMBULATORY_CARE_PROVIDER_SITE_OTHER): Payer: BC Managed Care – PPO | Admitting: "Endocrinology

## 2022-02-26 ENCOUNTER — Encounter: Payer: Self-pay | Admitting: "Endocrinology

## 2022-02-26 VITALS — BP 112/76 | HR 80 | Ht 69.0 in | Wt 169.2 lb

## 2022-02-26 DIAGNOSIS — E782 Mixed hyperlipidemia: Secondary | ICD-10-CM | POA: Diagnosis not present

## 2022-02-26 DIAGNOSIS — E559 Vitamin D deficiency, unspecified: Secondary | ICD-10-CM

## 2022-02-26 DIAGNOSIS — E1165 Type 2 diabetes mellitus with hyperglycemia: Secondary | ICD-10-CM | POA: Diagnosis not present

## 2022-02-26 LAB — POCT GLYCOSYLATED HEMOGLOBIN (HGB A1C): HbA1c, POC (controlled diabetic range): 7.6 % — AB (ref 0.0–7.0)

## 2022-02-26 MED ORDER — METFORMIN HCL ER 500 MG PO TB24: ORAL_TABLET | ORAL | 0 refills | Status: DC

## 2022-02-26 NOTE — Progress Notes (Signed)
02/26/2022, 11:52 AM  Endocrinology follow-up note   Subjective:    Patient ID: Carolyn Porter, female    DOB: December 27, 1965.  Carolyn Porter is being seen in follow-up after she was seen in consultation for management of currently uncontrolled symptomatic diabetes requested by  Marquis Buggy, [place].   Past Medical History:  Diagnosis Date   Allergy    Arthritis    Breast cancer ([place])    Colon polyp    Gastric irritation    GERD (gastroesophageal reflux disease)    Hyperlipidemia    Internal hemorrhoid     Past Surgical History:  Procedure Laterality Date   ABDOMINAL HYSTERECTOMY     BREAST LUMPECTOMY     COLONOSCOPY     portacath removal     RECTAL POLYPECTOMY      Social History   Socioeconomic History   Marital status: Married    Spouse name: Not on file   Number of children: 2   Years of education: Not on file   Highest education level: Not on file  Occupational History   Occupation: RN  Tobacco Use   Smoking status: Never   Smokeless tobacco: Never  Substance and Sexual Activity   Alcohol use: Yes    Alcohol/week: 0.0 standard drinks of alcohol    Comment: occ   Drug use: No   Sexual activity: Yes    Birth control/protection: Surgical  Other Topics Concern   Not on file  Social History Narrative   Not on file   Social Determinants of Health   Financial Resource Strain: Not on file  Food Insecurity: Not on file  Transportation Needs: Not on file  Physical Activity: Not on file  Stress: Not on file  Social Connections: Not on file    Family History  Problem Relation Age of Onset   Diabetes Mother    Diabetes Maternal Grandmother    Colon cancer Neg Hx    Esophageal cancer Neg Hx    Rectal cancer Neg Hx    Stomach cancer Neg Hx     Outpatient Encounter Medications as of 02/26/2022  Medication Sig   Accu-Chek FastClix Lancets MISC TEST BLOOD GLUCOSE TWICE DAILY    Ascorbic Acid (VITAMIN C) 250 MG CHEW Chew 1-2 tablets by mouth daily.   aspirin EC 81 MG tablet Take 81 mg by mouth daily.   b complex vitamins capsule Take 1 capsule by mouth daily.   co-enzyme Q-10 30 MG capsule Take 30 mg by mouth daily.    famotidine (PEPCID) 20 MG tablet Take 1 tablet (20 mg total) by mouth at bedtime.   fluticasone (FLONASE) 50 MCG/ACT nasal spray Place 2 sprays into both nostrils as needed.    glucose blood (ACCU-CHEK GUIDE) test strip USE TO TEST BLOOD GLUCOSE TWICE DAILY   loperamide (IMODIUM A-D) 2 MG tablet Take 2 mg by mouth as needed for diarrhea or loose stools.   metFORMIN (GLUCOPHAGE-XR) 500 MG 24 hr tablet TAKE 1 TABLET(500 MG) BY MOUTH TWICE DAILY AFTER A MEAL   Multiple Vitamin (MULTIVITAMIN) tablet Take 4 tablets by mouth daily.   pantoprazole (PROTONIX) 40 MG tablet TAKE 1 TABLET BY MOUTH  EVERY DAY,   rosuvastatin (CRESTOR) 20 MG tablet TAKE 1 TABLET(20 MG) BY MOUTH AT BEDTIME   Zinc 25 MG TABS Take 1 tablet by mouth daily.   [DISCONTINUED] metFORMIN (GLUCOPHAGE-XR) 500 MG 24 hr tablet TAKE 1 TABLET(500 MG) BY MOUTH TWICE DAILY AFTER A MEAL   No facility-administered encounter medications on file as of 02/26/2022.    ALLERGIES: Allergies  Allergen Reactions   Erythromycin Anaphylaxis    Patient allergic to all of the mycin medications    VACCINATION STATUS:  There is no immunization history on file for this patient.  Diabetes She presents for her follow-up diabetic visit. She has type 2 diabetes mellitus. Onset time: She was diagnosed at approximate age of 50 years. Her disease course has been worsening. There are no hypoglycemic associated symptoms. Pertinent negatives for hypoglycemia include no confusion, headaches, pallor or seizures. There are no diabetic associated symptoms. Pertinent negatives for diabetes include no chest pain, no polydipsia, no polyphagia and no polyuria. There are no hypoglycemic complications. Symptoms are worsening. There  are no diabetic complications. Risk factors for coronary artery disease include diabetes mellitus and dyslipidemia. When asked about current treatments, none were reported. Her weight is increasing steadily. She is following a generally unhealthy diet. When asked about meal planning, she reported none. She has not had a previous visit with a dietitian. She participates in exercise intermittently. Her home blood glucose trend is increasing steadily. (She presents with worsening glycemic profile indicated by rising A1c of 7.6% from 7.2%.  She remains on metformin 500 mg p.o. twice daily.  She continues to tolerate this intervention.   ) An ACE inhibitor/angiotensin II receptor blocker is not being taken. Eye exam is current.  Hyperlipidemia This is a chronic problem. The current episode started more than 1 year ago. The problem is uncontrolled. Exacerbating diseases include diabetes. Pertinent negatives include no chest pain, myalgias or shortness of breath. Current antihyperlipidemic treatment includes statins. Risk factors for coronary artery disease include dyslipidemia and diabetes mellitus.    Review of Systems  Constitutional:  Negative for chills, fever and unexpected weight change.  HENT:  Negative for trouble swallowing and voice change.   Eyes:  Negative for visual disturbance.  Respiratory:  Negative for cough, shortness of breath and wheezing.   Cardiovascular:  Negative for chest pain, palpitations and leg swelling.  Gastrointestinal:  Negative for diarrhea, nausea and vomiting.  Endocrine: Negative for cold intolerance, heat intolerance, polydipsia, polyphagia and polyuria.  Musculoskeletal:  Negative for arthralgias and myalgias.  Skin:  Negative for color change, pallor, rash and wound.  Neurological:  Negative for seizures and headaches.  Psychiatric/Behavioral:  Negative for confusion and suicidal ideas.     Objective:    BP 112/76   Pulse 80   Ht '5\' 9"'$  (1.753 m)   Wt 169 lb  3.2 oz (76.7 kg)   BMI 24.99 kg/m   Wt Readings from Last 3 Encounters:  02/26/22 169 lb 3.2 oz (76.7 kg)  08/27/21 166 lb (75.3 kg)  08/23/21 161 lb 9.6 oz (73.3 kg)       CMP ( most recent) CMP     Component Value Date/Time   NA 142 02/15/2022 0942   K 4.6 02/15/2022 0942   CL 105 02/15/2022 0942   CO2 22 02/15/2022 0942   GLUCOSE 152 (H) 02/15/2022 0942   GLUCOSE 105 (H) 03/15/2012 1715   BUN 15 02/15/2022 0942   CREATININE 0.73 02/15/2022 0942   CALCIUM 10.0 02/15/2022 7209  PROT 7.0 02/15/2022 0942   ALBUMIN 4.5 02/15/2022 0942   AST 21 02/15/2022 0942   ALT 33 (H) 02/15/2022 0942   ALKPHOS 111 02/15/2022 0942   BILITOT 0.3 02/15/2022 0942   GFRNONAA 81 04/05/2020 0000   GFRAA 93 04/05/2020 0000    Diabetic Labs (most recent): Lab Results  Component Value Date   HGBA1C 7.6 (A) 02/26/2022   HGBA1C 7.2 (A) 08/23/2021   HGBA1C 7.8 04/18/2021    Lab Results  Component Value Date   TSH 3.980 02/15/2022   TSH 0.47 04/18/2021   TSH 1.30 10/27/2020   TSH 2.88 04/05/2020   TSH 1.85 01/06/2019   FREET4 1.05 02/15/2022    Lipid Panel     Component Value Date/Time   CHOL 191 02/15/2022 0942   TRIG 124 02/15/2022 0942   HDL 45 02/15/2022 0942   CHOLHDL 4.2 02/15/2022 0942   LDLCALC 124 (H) 02/15/2022 0942   LABVLDL 22 02/15/2022 0942     Assessment & Plan:   1. Uncontrolled type 2 diabetes mellitus with hyperglycemia ([place])  - Carolyn Porter has currently uncontrolled symptomatic type 2 DM since  56 years of age. She presents with worsening glycemic profile indicated by rising A1c of 7.6% from 7.2%.  She remains on metformin 500 mg p.o. twice daily.  She continues to tolerate this intervention.    - Recent labs reviewed. - I had a long discussion with her about the progressive nature of diabetes and the pathology behind its complications. -She does not report any complications, however,  she remains at a high risk for acute and chronic complications  which include CAD, CVA, CKD, retinopathy, and neuropathy. These are all discussed in detail with her.  - I have counseled her on diet  and weight management  by adopting a carbohydrate restricted/protein rich diet. Patient is encouraged to switch to  unprocessed or minimally processed     complex starch and increased protein intake (mainly plant source), fruits, and vegetables. - she acknowledges that there is a room for improvement in her food and drink choices. - Suggestion is made for her to avoid simple carbohydrates  from her diet including Cakes, Sweet Desserts, Ice Cream, Soda (diet and regular), Sweet Tea, Candies, Chips, Cookies, Store Bought Juices, Alcohol , Artificial Sweeteners,  Coffee Creamer, and "Sugar-free" Products, Lemonade. This will help patient to have more stable blood glucose profile and potentially avoid unintended weight gain.  The following Lifestyle Medicine recommendations according to [place]  [facility]) were discussed and and offered to patient and she  agrees to start the journey:  A. Whole Foods, Plant-Based Nutrition comprising of fruits and vegetables, plant-based proteins, whole-grain carbohydrates was discussed in detail with the patient.   A list for source of those nutrients were also provided to the patient.  Patient will use only water or unsweetened tea for hydration. B.  The need to stay away from risky substances including alcohol, smoking; obtaining 7 to 9 hours of restorative sleep, at least 150 minutes of moderate intensity exercise weekly, the importance of healthy social connections,  and stress management techniques were discussed. C.  A full color page of  Calorie density of various food groups per pound showing examples of each food groups was provided to the patient.   - I have approached her with the following individualized plan to manage  her diabetes and patient agrees:  -In light of her presentation with uncontrolled  glycemic profile with A1c  of 7.6%, she was approached for additional intervention with a GLP-1 receptor agonist weekly, however, patient declined this option at this time.  She does not want to maximize her lifestyle medicine engagement.  She is advised to continue metformin 500 mg p.o. twice daily.     -She has the option of going on Trulicity or glipizide if A1c continues to increase during her next visit.    - Specific targets for  A1c;  LDL, HDL, Triglycerides, were discussed with the patient.  2) Blood Pressure /Hypertension:  -Her blood pressure is controlled to target.  She is not on antihypertensive medications currently.  3) Lipids/Hyperlipidemia: Her previsit lipid panel is unchanged at LDL of 124 after improving from 176 prior to her last visit.  It appears that he has disengaged from whole food plant-based diet between these 2 visits.  She would like to resume her lifestyle medicine more time.  She is advised to continue Crestor 20 mg p.o. nightly.   4)  Weight/Diet:  Body mass index is 24.99 kg/m.  -   she is not a candidate for major  weight loss.  She is advised on exercise, and detailed carbohydrates information provided  -  detailed on discharge instructions.   5) vitamin D deficiency: She is now vitamin D replete at 43.  She is advised to discontinue her supplements until next measurement before next visit in 6 months.   6) Chronic Care/Health Maintenance:  -she  Is  on Statin medications and  is encouraged to initiate and continue to follow up with Ophthalmology, Dentist,  Podiatrist at least yearly or according to recommendations, and advised to  stay away from smoking. I have recommended yearly flu vaccine and pneumonia vaccine at least every 5 years; moderate intensity exercise for up to 150 minutes weekly; and  sleep for at least 7 hours a day.    - she is  advised to maintain close follow up with Marquis Buggy, FNP for primary care needs, as well as her other  providers for optimal and coordinated care.    I spent 35 minutes in the care of the patient today including review of labs from [place], Lipids, Thyroid Function, Hematology (current and previous including abstractions from other facilities); face-to-face time discussing  her blood glucose readings/logs, discussing hypoglycemia and hyperglycemia episodes and symptoms, medications doses, her options of short and long term treatment based on the latest standards of care / guidelines;  discussion about incorporating lifestyle medicine;  and documenting the encounter. Risk reduction counseling performed per USPSTF guidelines to reduce  cardiovascular risk factors.     Please refer to Patient Instructions for Blood Glucose Monitoring and Insulin/Medications Dosing Guide"  in media tab for additional information. Please  also refer to " Patient Self Inventory" in the Media  tab for reviewed elements of pertinent patient history.  Carolyn Porter participated in the discussions, expressed understanding, and voiced agreement with the above plans.  All questions were answered to her satisfaction. she is encouraged to contact clinic should she have any questions or concerns prior to her return visit.      Follow up plan: - Return in about 3 months (around 05/28/2022) for Fasting Labs  in AM B4 8, A1c -[place].  Glade Lloyd, MD [facility] [address] [place], [place] [address] Phone: [phone]  Fax: [phone]    02/26/2022, 11:52 AM  This note was partially dictated with voice recognition software. Similar sounding words can  be transcribed inadequately or may not  be corrected upon review.

## 2022-02-26 NOTE — Patient Instructions (Signed)

## 2022-03-01 ENCOUNTER — Other Ambulatory Visit: Payer: Self-pay | Admitting: "Endocrinology

## 2022-05-23 LAB — CBC AND DIFFERENTIAL
HCT: 37 (ref 36–46)
Hemoglobin: 12.3 (ref 12.0–16.0)
Platelets: 264 10*3/uL (ref 150–400)
WBC: 4.9

## 2022-05-23 LAB — HEPATIC FUNCTION PANEL
ALT: 22 U/L (ref 7–35)
AST: 21 (ref 13–35)
Alkaline Phosphatase: 98 (ref 25–125)
Bilirubin, Total: 0.4

## 2022-05-23 LAB — TSH: TSH: 3.17 (ref 0.41–5.90)

## 2022-05-23 LAB — LIPID PANEL
Cholesterol: 187 (ref 0–200)
HDL: 52 (ref 35–70)
LDL Cholesterol: 116
Triglycerides: 107 (ref 40–160)

## 2022-05-23 LAB — BASIC METABOLIC PANEL
BUN: 15 (ref 4–21)
CO2: 23 — AB (ref 13–22)
Chloride: 105 (ref 99–108)
Creatinine: 0.7 (ref 0.5–1.1)
Glucose: 133
Potassium: 4.4 mEq/L (ref 3.5–5.1)
Sodium: 141 (ref 137–147)

## 2022-05-23 LAB — VITAMIN D 25 HYDROXY (VIT D DEFICIENCY, FRACTURES): Vit D, 25-Hydroxy: 54.6

## 2022-05-23 LAB — COMPREHENSIVE METABOLIC PANEL
Albumin: 4.7 (ref 3.5–5.0)
Calcium: 10.4 (ref 8.7–10.7)
Globulin: 2.1

## 2022-05-23 LAB — HEMOGLOBIN A1C: Hemoglobin A1C: 7.3

## 2022-05-23 LAB — CBC: RBC: 4.13 (ref 3.87–5.11)

## 2022-06-04 ENCOUNTER — Encounter: Payer: Self-pay | Admitting: "Endocrinology

## 2022-06-04 ENCOUNTER — Ambulatory Visit (INDEPENDENT_AMBULATORY_CARE_PROVIDER_SITE_OTHER): Payer: BC Managed Care – PPO | Admitting: "Endocrinology

## 2022-06-04 VITALS — BP 100/78 | HR 80 | Ht 69.0 in | Wt 164.6 lb

## 2022-06-04 DIAGNOSIS — E559 Vitamin D deficiency, unspecified: Secondary | ICD-10-CM | POA: Diagnosis not present

## 2022-06-04 DIAGNOSIS — E1165 Type 2 diabetes mellitus with hyperglycemia: Secondary | ICD-10-CM

## 2022-06-04 DIAGNOSIS — E782 Mixed hyperlipidemia: Secondary | ICD-10-CM

## 2022-06-04 NOTE — Progress Notes (Signed)
06/04/2022, 1:08 PM  Endocrinology follow-up note   Subjective:    Patient ID: Carolyn Porter, female    DOB: 1965-10-16.  Carolyn Porter is being seen in follow-up after she was seen in consultation for management of currently controlled asymptomatic type II diabetes requested by  Marquis Buggy, [place]. She has comorbid hyperlipidemia currently on statins.  See below.  Past Medical History:  Diagnosis Date   Allergy    Arthritis    Breast cancer ([place])    Colon polyp    Gastric irritation    GERD (gastroesophageal reflux disease)    Hyperlipidemia    Internal hemorrhoid     Past Surgical History:  Procedure Laterality Date   ABDOMINAL HYSTERECTOMY     BREAST LUMPECTOMY     COLONOSCOPY     portacath removal     RECTAL POLYPECTOMY      Social History   Socioeconomic History   Marital status: Married    Spouse name: Not on file   Number of children: 2   Years of education: Not on file   Highest education level: Not on file  Occupational History   Occupation: RN  Tobacco Use   Smoking status: Never   Smokeless tobacco: Never  Substance and Sexual Activity   Alcohol use: Yes    Alcohol/week: 0.0 standard drinks of alcohol    Comment: occ   Drug use: No   Sexual activity: Yes    Birth control/protection: Surgical  Other Topics Concern   Not on file  Social History Narrative   Not on file   Social Determinants of Health   Financial Resource Strain: Not on file  Food Insecurity: Not on file  Transportation Needs: Not on file  Physical Activity: Not on file  Stress: Not on file  Social Connections: Not on file    Family History  Problem Relation Age of Onset   Diabetes Mother    Diabetes Maternal Grandmother    Colon cancer Neg Hx    Esophageal cancer Neg Hx    Rectal cancer Neg Hx    Stomach cancer Neg Hx     Outpatient Encounter Medications as of 06/04/2022  Medication Sig    Accu-Chek FastClix Lancets MISC TEST BLOOD GLUCOSE TWICE DAILY   Ascorbic Acid (VITAMIN C) 250 MG CHEW Chew 1-2 tablets by mouth daily.   aspirin EC 81 MG tablet Take 81 mg by mouth daily.   b complex vitamins capsule Take 1 capsule by mouth daily.   co-enzyme Q-10 30 MG capsule Take 30 mg by mouth daily.    famotidine (PEPCID) 20 MG tablet Take 1 tablet (20 mg total) by mouth at bedtime. (Patient taking differently: Take 20 mg by mouth daily as needed.)   fluticasone (FLONASE) 50 MCG/ACT nasal spray Place 2 sprays into both nostrils as needed.    glucose blood (ACCU-CHEK GUIDE) test strip USE TO TEST BLOOD GLUCOSE TWICE DAILY   loperamide (IMODIUM A-D) 2 MG tablet Take 2 mg by mouth as needed for diarrhea or loose stools.   metFORMIN (GLUCOPHAGE-XR) 500 MG 24 hr tablet TAKE 1 TABLET(500 MG) BY MOUTH TWICE DAILY AFTER A MEAL  Multiple Vitamin (MULTIVITAMIN) tablet Take 2 tablets by mouth daily.   pantoprazole (PROTONIX) 40 MG tablet TAKE 1 TABLET BY MOUTH EVERY DAY,   rosuvastatin (CRESTOR) 20 MG tablet TAKE 1 TABLET(20 MG) BY MOUTH AT BEDTIME   Zinc 25 MG TABS Take 1 tablet by mouth daily.   No facility-administered encounter medications on file as of 06/04/2022.    ALLERGIES: Allergies  Allergen Reactions   Erythromycin Anaphylaxis    Patient allergic to all of the mycin medications    VACCINATION STATUS:  There is no immunization history on file for this patient.  Diabetes She presents for her follow-up diabetic visit. She has type 2 diabetes mellitus. Onset time: She was diagnosed at approximate age of 57 years. Her disease course has been improving. There are no hypoglycemic associated symptoms. Pertinent negatives for hypoglycemia include no confusion, headaches, pallor or seizures. There are no diabetic associated symptoms. Pertinent negatives for diabetes include no chest pain, no polydipsia, no polyphagia and no polyuria. There are no hypoglycemic complications. Symptoms  are improving. There are no diabetic complications. Risk factors for coronary artery disease include diabetes mellitus and dyslipidemia. Current diabetic treatments: Metformin 500 mg p.o. twice daily. Her weight is fluctuating minimally. She has not had a previous visit with a dietitian. (She presents with worsening glycemic profile indicated by rising A1c of 7.6% from 7.2%.  She remains on metformin 500 mg p.o. twice daily.  She continues to tolerate this intervention.   ) An ACE inhibitor/angiotensin II receptor blocker is not being taken. Eye exam is current.  Hyperlipidemia This is a chronic problem. The current episode started more than 1 year ago. The problem is uncontrolled. Exacerbating diseases include diabetes. Pertinent negatives include no chest pain, myalgias or shortness of breath. Current antihyperlipidemic treatment includes statins (She is consistent taking Crestor 20 mg p.o. nightly.). Risk factors for coronary artery disease include dyslipidemia and diabetes mellitus.    Review of Systems  Constitutional:  Negative for chills, fever and unexpected weight change.  HENT:  Negative for trouble swallowing and voice change.   Eyes:  Negative for visual disturbance.  Respiratory:  Negative for cough, shortness of breath and wheezing.   Cardiovascular:  Negative for chest pain, palpitations and leg swelling.  Gastrointestinal:  Negative for diarrhea, nausea and vomiting.  Endocrine: Negative for cold intolerance, heat intolerance, polydipsia, polyphagia and polyuria.  Musculoskeletal:  Negative for arthralgias and myalgias.  Skin:  Negative for color change, pallor, rash and wound.  Neurological:  Negative for seizures and headaches.  Psychiatric/Behavioral:  Negative for confusion and suicidal ideas.     Objective:    BP 100/78   Pulse 80   Ht '5\' 9"'$  (1.753 m)   Wt 164 lb 9.6 oz (74.7 kg)   BMI 24.31 kg/m   Wt Readings from Last 3 Encounters:  06/04/22 164 lb 9.6 oz (74.7 kg)   02/26/22 169 lb 3.2 oz (76.7 kg)  08/27/21 166 lb (75.3 kg)       CMP ( most recent) CMP     Component Value Date/Time   NA 141 05/23/2022 0000   K 4.4 05/23/2022 0000   CL 105 05/23/2022 0000   CO2 23 (A) 05/23/2022 0000   GLUCOSE 152 (H) 02/15/2022 0942   GLUCOSE 105 (H) 03/15/2012 1715   BUN 15 05/23/2022 0000   CREATININE 0.7 05/23/2022 0000   CREATININE 0.73 02/15/2022 0942   CALCIUM 10.4 05/23/2022 0000   PROT 7.0 02/15/2022 0942   ALBUMIN 4.7  05/23/2022 0000   ALBUMIN 4.5 02/15/2022 0942   AST 21 05/23/2022 0000   ALT 22 05/23/2022 0000   ALKPHOS 98 05/23/2022 0000   BILITOT 0.3 02/15/2022 0942   GFRNONAA 81 04/05/2020 0000   GFRAA 93 04/05/2020 0000    Diabetic Labs (most recent): Lab Results  Component Value Date   HGBA1C 7.3 05/23/2022   HGBA1C 7.6 (A) 02/26/2022   HGBA1C 7.2 (A) 08/23/2021    Lab Results  Component Value Date   TSH 3.17 05/23/2022   TSH 3.980 02/15/2022   TSH 0.47 04/18/2021   TSH 1.30 10/27/2020   TSH 2.88 04/05/2020   TSH 1.85 01/06/2019   FREET4 1.05 02/15/2022    Lipid Panel     Component Value Date/Time   CHOL 187 05/23/2022 0000   CHOL 191 02/15/2022 0942   TRIG 107 05/23/2022 0000   HDL 52 05/23/2022 0000   HDL 45 02/15/2022 0942   CHOLHDL 4.2 02/15/2022 0942   LDLCALC 116 05/23/2022 0000   LDLCALC 124 (H) 02/15/2022 0942   LABVLDL 22 02/15/2022 0942     Assessment & Plan:   1. Uncontrolled type 2 diabetes mellitus with hyperglycemia ([place])  - Carolyn Porter has currently uncontrolled symptomatic type 2 DM since  56 years of age. She presents with improved glycemic profile indicated by her A1c of 7.3%.    - Recent labs reviewed. - I had a long discussion with her about the progressive nature of diabetes and the pathology behind its complications. -She does not report any complications, however,  she remains at a high risk for acute and chronic complications which include CAD, CVA, CKD, retinopathy, and  neuropathy. These are all discussed in detail with her.  - I have counseled her on diet  and weight management  by adopting a whole food plant-based diet.   Patient is encouraged to switch to  unprocessed or minimally processed     complex starch and increased protein intake (mainly plant source), fruits, and vegetables. - she has not engaged with lifestyle medicine optimally, however ,acknowledges that there is a room for improvement in her food and drink choices. - Suggestion is made for her to avoid simple carbohydrates  from her diet including Cakes, Sweet Desserts, Ice Cream, Soda (diet and regular), Sweet Tea, Candies, Chips, Cookies, Store Bought Juices, Alcohol , Artificial Sweeteners,  Coffee Creamer, and "Sugar-free" Products, Lemonade. This will help patient to have more stable blood glucose profile and potentially avoid unintended weight gain.  The following Lifestyle Medicine recommendations according to [facility]) were discussed and and offered to patient and she  agrees to start the journey:  A. Whole Foods, Plant-Based Nutrition comprising of fruits and vegetables, plant-based proteins, whole-grain carbohydrates was discussed in detail with the patient.   A list for source of those nutrients were also provided to the patient.  Patient will use only water or unsweetened tea for hydration. B.  The need to stay away from risky substances including alcohol, smoking; obtaining 7 to 9 hours of restorative sleep, at least 150 minutes of moderate intensity exercise weekly, the importance of healthy social connections,  and stress management techniques were discussed. C.  A full color page of  Calorie density of various food groups per pound showing examples of each food groups was provided to the patient.   - I have approached her with the following individualized plan to manage  her diabetes and patient agrees:  -In  light of her presentation with improved  glycemic profile, she will not need additional medication intervention at this time.  She will continue to benefit from metformin.  I discussed and continue metformin 500 mg p.o. twice daily.   -She has the option of going on Trulicity/Ozempic weekly or glipizide if A1c increases to above 7% during her next visit.     - Specific targets for  A1c;  LDL, HDL, Triglycerides, were discussed with the patient.  2) Blood Pressure /Hypertension:  -Her blood pressure is controlled to target.  She is not on antihypertensive medications currently.  3) Lipids/Hyperlipidemia: Her previsit lipid panel is improving to 116 mg per DL.  This is likely the result of her Crestor.  She is advised to continue Crestor 20 mg p.o. nightly.  The above detailed whole food plant-based diet will also help with dyslipidemia.   4)  Weight/Diet:  Body mass index is 24.31 kg/m.  -   she is not a candidate for major  weight loss.  5-10 pounds of weight loss will help her metabolic profile.  She is advised on exercise, and detailed carbohydrates information provided  -  detailed on discharge instructions.   5) vitamin D deficiency: She is now vitamin D replete at 39.  She is advised to continue multivitamin supplement once a day.    Regarding her newly noticed hypercalcemia at 10.4, she will have further studies including PTH/calcium, serum magnesium and phosphorus.      - she is  advised to maintain close follow up with Marquis Buggy, FNP for primary care needs, as well as her other providers for optimal and coordinated care.  I spent 28 minutes in the care of the patient today including review of labs from [place], Lipids, Thyroid Function, Hematology (current and previous including abstractions from other facilities); face-to-face time discussing  her blood glucose readings/logs, discussing hypoglycemia and hyperglycemia episodes and symptoms, medications doses, her options of short and long term treatment based on the latest  standards of care / guidelines;  discussion about incorporating lifestyle medicine;  and documenting the encounter. Risk reduction counseling performed per USPSTF guidelines to reduce cardiovascular risk factors.     Please refer to Patient Instructions for Blood Glucose Monitoring and Insulin/Medications Dosing Guide"  in media tab for additional information. Please  also refer to " Patient Self Inventory" in the Media  tab for reviewed elements of pertinent patient history.  Carolyn Porter participated in the discussions, expressed understanding, and voiced agreement with the above plans.  All questions were answered to her satisfaction. she is encouraged to contact clinic should she have any questions or concerns prior to her return visit.    Follow up plan: - Return in about 4 months (around 10/04/2022) for Fasting Labs  in AM B4 8.  Glade Lloyd, MD [facility] [address] [place], [place] [address] Phone: [phone]  Fax: [phone]    06/04/2022, 1:08 PM  This note was partially dictated with voice recognition software. Similar sounding words can be transcribed inadequately or may not  be corrected upon review.

## 2022-06-04 NOTE — Patient Instructions (Signed)

## 2022-06-25 ENCOUNTER — Other Ambulatory Visit: Payer: Self-pay | Admitting: "Endocrinology

## 2022-09-18 ENCOUNTER — Other Ambulatory Visit: Payer: Self-pay | Admitting: "Endocrinology

## 2022-09-18 ENCOUNTER — Other Ambulatory Visit: Payer: Self-pay | Admitting: Internal Medicine

## 2022-10-03 LAB — LIPID PANEL
Chol/HDL Ratio: 3.4 ratio (ref 0.0–4.4)
Cholesterol, Total: 165 mg/dL (ref 100–199)
HDL: 49 mg/dL (ref >39)
LDL Chol Calc (NIH): 99 mg/dL (ref 0–99)
Triglycerides: 94 mg/dL (ref 0–149)
VLDL Cholesterol Cal: 17 mg/dL (ref 5–40)

## 2022-10-03 LAB — PTH, INTACT AND CALCIUM
Calcium: 10 mg/dL (ref 8.7–10.2)
PTH: 46 pg/mL (ref 15–65)

## 2022-10-03 LAB — MAGNESIUM: Magnesium: 1.8 mg/dL (ref 1.6–2.3)

## 2022-10-03 LAB — PHOSPHORUS: Phosphorus: 2.7 mg/dL — ABNORMAL LOW (ref 3.0–4.3)

## 2022-10-08 ENCOUNTER — Encounter: Payer: Self-pay | Admitting: "Endocrinology

## 2022-10-08 ENCOUNTER — Ambulatory Visit (INDEPENDENT_AMBULATORY_CARE_PROVIDER_SITE_OTHER): Payer: BC Managed Care – PPO | Admitting: "Endocrinology

## 2022-10-08 VITALS — BP 128/76 | HR 76 | Ht 69.0 in | Wt 158.4 lb

## 2022-10-08 DIAGNOSIS — E559 Vitamin D deficiency, unspecified: Secondary | ICD-10-CM | POA: Diagnosis not present

## 2022-10-08 DIAGNOSIS — E1165 Type 2 diabetes mellitus with hyperglycemia: Secondary | ICD-10-CM

## 2022-10-08 DIAGNOSIS — E782 Mixed hyperlipidemia: Secondary | ICD-10-CM

## 2022-10-08 LAB — POCT GLYCOSYLATED HEMOGLOBIN (HGB A1C): HbA1c, POC (controlled diabetic range): 6.5 % (ref 0.0–7.0)

## 2022-10-08 MED ORDER — ROSUVASTATIN CALCIUM 20 MG PO TABS: ORAL_TABLET | ORAL | 1 refills | Status: DC

## 2022-10-08 MED ORDER — METFORMIN HCL ER 500 MG PO TB24: 500.0000 mg | ORAL_TABLET | Freq: Two times a day (BID) | ORAL | 1 refills | Status: DC

## 2022-10-08 NOTE — Progress Notes (Signed)
10/08/2022, 6:14 PM  Endocrinology follow-up note   Subjective:    Patient ID: Carolyn Porter, female    DOB: Mar 02, 1966.  Brody R Nusbaum is being seen in follow-up after she was seen in consultation for management of currently controlled asymptomatic type II diabetes requested by  Wonda Cheng, FNP. She has comorbid hyperlipidemia currently on statins.  See below.  Past Medical History:  Diagnosis Date   Allergy    Arthritis    Breast cancer    Colon polyp    Gastric irritation    GERD (gastroesophageal reflux disease)    Hyperlipidemia    Internal hemorrhoid     Past Surgical History:  Procedure Laterality Date   ABDOMINAL HYSTERECTOMY     BREAST LUMPECTOMY     COLONOSCOPY     portacath removal     RECTAL POLYPECTOMY      Social History   Socioeconomic History   Marital status: Married    Spouse name: Not on file   Number of children: 2   Years of education: Not on file   Highest education level: Not on file  Occupational History   Occupation: RN  Tobacco Use   Smoking status: Never   Smokeless tobacco: Never  Substance and Sexual Activity   Alcohol use: Yes    Alcohol/week: 0.0 standard drinks of alcohol    Comment: occ   Drug use: No   Sexual activity: Yes    Birth control/protection: Surgical  Other Topics Concern   Not on file  Social History Narrative   Not on file   Social Determinants of Health   Financial Resource Strain: Not on file  Food Insecurity: Not on file  Transportation Needs: Not on file  Physical Activity: Not on file  Stress: Not on file  Social Connections: Not on file    Family History  Problem Relation Age of Onset   Diabetes Mother    Diabetes Maternal Grandmother    Colon cancer Neg Hx    Esophageal cancer Neg Hx    Rectal cancer Neg Hx    Stomach cancer Neg Hx     Outpatient Encounter Medications as of 10/08/2022  Medication Sig    azelastine (ASTELIN) 0.1 % nasal spray Place into both nostrils 2 (two) times daily. Use in each nostril as directed   Accu-Chek FastClix Lancets MISC TEST BLOOD GLUCOSE TWICE DAILY   Ascorbic Acid (VITAMIN C) 250 MG CHEW Chew 1-2 tablets by mouth daily.   aspirin EC 81 MG tablet Take 81 mg by mouth daily.   b complex vitamins capsule Take 1 capsule by mouth daily.   co-enzyme Q-10 30 MG capsule Take 30 mg by mouth daily.    famotidine (PEPCID) 20 MG tablet Take 1 tablet (20 mg total) by mouth at bedtime. (Patient taking differently: Take 20 mg by mouth daily as needed.)   fluticasone (FLONASE) 50 MCG/ACT nasal spray Place 2 sprays into both nostrils as needed.    glucose blood (ACCU-CHEK GUIDE) test strip USE TO TEST BLOOD GLUCOSE TWICE DAILY   loperamide (IMODIUM A-D) 2 MG tablet Take 2 mg by mouth as needed for diarrhea or loose stools.  metFORMIN (GLUCOPHAGE-XR) 500 MG 24 hr tablet Take 1 tablet (500 mg total) by mouth 2 (two) times daily with a meal.   Multiple Vitamin (MULTIVITAMIN) tablet Take 2 tablets by mouth daily.   pantoprazole (PROTONIX) 40 MG tablet TAKE 1 TABLET BY MOUTH EVERY DAY(NEED OFFICE VISIT FOR FURTHER REFILLS)   rosuvastatin (CRESTOR) 20 MG tablet TAKE 1 TABLET(20 MG) BY MOUTH AT BEDTIME   Zinc 25 MG TABS Take 1 tablet by mouth daily.   [DISCONTINUED] metFORMIN (GLUCOPHAGE-XR) 500 MG 24 hr tablet TAKE 1 TABLET(500 MG) BY MOUTH TWICE DAILY AFTER A MEAL   [DISCONTINUED] rosuvastatin (CRESTOR) 20 MG tablet TAKE 1 TABLET(20 MG) BY MOUTH AT BEDTIME   No facility-administered encounter medications on file as of 10/08/2022.    ALLERGIES: Allergies  Allergen Reactions   Erythromycin Anaphylaxis    Patient allergic to all of the mycin medications    VACCINATION STATUS:  There is no immunization history on file for this patient.  Diabetes She presents for her follow-up diabetic visit. She has type 2 diabetes mellitus. Onset time: She was diagnosed at approximate age of  57 years. Her disease course has been improving. There are no hypoglycemic associated symptoms. Pertinent negatives for hypoglycemia include no confusion, headaches, pallor or seizures. There are no diabetic associated symptoms. Pertinent negatives for diabetes include no chest pain, no polydipsia, no polyphagia and no polyuria. There are no hypoglycemic complications. Symptoms are improving. There are no diabetic complications. Risk factors for coronary artery disease include diabetes mellitus and dyslipidemia. Current diabetic treatments: Metformin 500 mg p.o. twice daily. Her weight is decreasing steadily. She has not had a previous visit with a dietitian. Her home blood glucose trend is decreasing steadily. (She presents with improving glycemic profile and point-of-care A1c of 6.5% improving from 7.3% during her last visit.  She continues to tolerate low-dose metformin.   ) An ACE inhibitor/angiotensin II receptor blocker is not being taken. Eye exam is current.  Hyperlipidemia This is a chronic problem. The current episode started more than 1 year ago. The problem is uncontrolled. Exacerbating diseases include diabetes. Pertinent negatives include no chest pain, myalgias or shortness of breath. Current antihyperlipidemic treatment includes statins (She is consistent taking Crestor 20 mg p.o. nightly.). Risk factors for coronary artery disease include dyslipidemia and diabetes mellitus.    Review of Systems  Constitutional:  Negative for chills, fever and unexpected weight change.  HENT:  Negative for trouble swallowing and voice change.   Eyes:  Negative for visual disturbance.  Respiratory:  Negative for cough, shortness of breath and wheezing.   Cardiovascular:  Negative for chest pain, palpitations and leg swelling.  Gastrointestinal:  Negative for diarrhea, nausea and vomiting.  Endocrine: Negative for cold intolerance, heat intolerance, polydipsia, polyphagia and polyuria.  Musculoskeletal:   Negative for arthralgias and myalgias.  Skin:  Negative for color change, pallor, rash and wound.  Neurological:  Negative for seizures and headaches.  Psychiatric/Behavioral:  Negative for confusion and suicidal ideas.     Objective:    BP 128/76   Pulse 76   Ht 5\' 9"  (1.753 m)   Wt 158 lb 6.4 oz (71.8 kg)   BMI 23.39 kg/m   Wt Readings from Last 3 Encounters:  10/08/22 158 lb 6.4 oz (71.8 kg)  06/04/22 164 lb 9.6 oz (74.7 kg)  02/26/22 169 lb 3.2 oz (76.7 kg)       CMP ( most recent) CMP     Component Value Date/Time   NA  141 05/23/2022 0000   K 4.4 05/23/2022 0000   CL 105 05/23/2022 0000   CO2 23 (A) 05/23/2022 0000   GLUCOSE 152 (H) 02/15/2022 0942   GLUCOSE 105 (H) 03/15/2012 1715   BUN 15 05/23/2022 0000   CREATININE 0.7 05/23/2022 0000   CREATININE 0.73 02/15/2022 0942   CALCIUM 10.0 10/02/2022 1037   PROT 7.0 02/15/2022 0942   ALBUMIN 4.7 05/23/2022 0000   ALBUMIN 4.5 02/15/2022 0942   AST 21 05/23/2022 0000   ALT 22 05/23/2022 0000   ALKPHOS 98 05/23/2022 0000   BILITOT 0.3 02/15/2022 0942   GFRNONAA 81 04/05/2020 0000   GFRAA 93 04/05/2020 0000    Diabetic Labs (most recent): Lab Results  Component Value Date   HGBA1C 6.5 10/08/2022   HGBA1C 7.3 05/23/2022   HGBA1C 7.6 (A) 02/26/2022    Lab Results  Component Value Date   TSH 3.17 05/23/2022   TSH 3.980 02/15/2022   TSH 0.47 04/18/2021   TSH 1.30 10/27/2020   TSH 2.88 04/05/2020   TSH 1.85 01/06/2019   FREET4 1.05 02/15/2022    Lipid Panel     Component Value Date/Time   CHOL 165 10/02/2022 1037   TRIG 94 10/02/2022 1037   HDL 49 10/02/2022 1037   CHOLHDL 3.4 10/02/2022 1037   LDLCALC 99 10/02/2022 1037   LABVLDL 17 10/02/2022 1037     Assessment & Plan:   1. Uncontrolled type 2 diabetes mellitus with hyperglycemia (HCC)  - Armentha R Mccown has currently uncontrolled symptomatic type 2 DM since  57 years of age. She presents with improving glycemic profile and point-of-care  A1c of 6.5% improving from 7.3% during her last visit.  She continues to tolerate low-dose metformin.    - Recent labs reviewed. - I had a long discussion with her about the progressive nature of diabetes and the pathology behind its complications. -She does not report any complications, however,  she remains at a high risk for acute and chronic complications which include CAD, CVA, CKD, retinopathy, and neuropathy. These are all discussed in detail with her.  - she has engaged in lifestyle medicine and benefiting from it.     - Suggestion is made for her to avoid simple carbohydrates  from her diet including Cakes, Sweet Desserts, Ice Cream, Soda (diet and regular), Sweet Tea, Candies, Chips, Cookies, Store Bought Juices, Alcohol , Artificial Sweeteners,  Coffee Creamer, and "Sugar-free" Products, Lemonade. This will help patient to have more stable blood glucose profile and potentially avoid unintended weight gain.  The following Lifestyle Medicine recommendations according to American College of Lifestyle Medicine  [facility]) were discussed and and offered to patient and she  agrees to start the journey:  A. Whole Foods, Plant-Based Nutrition comprising of fruits and vegetables, plant-based proteins, whole-grain carbohydrates was discussed in detail with the patient.   A list for source of those nutrients were also provided to the patient.  Patient will use only water or unsweetened tea for hydration. B.  The need to stay away from risky substances including alcohol, smoking; obtaining 7 to 9 hours of restorative sleep, at least 150 minutes of moderate intensity exercise weekly, the importance of healthy social connections,  and stress management techniques were discussed. C.  A full color page of  Calorie density of various food groups per pound showing examples of each food groups was provided to the patient.   - I have approached her with the following individualized plan to manage  her  diabetes  and patient agrees:  -In light of her presentation with target A1c of 6.5%, she will not need additional medication intervention at this time.  She is however advised to stay on metformin 500 mg p.o. twice daily.    -She has the option of going on Trulicity/Ozempic weekly or glipizide if A1c increases to above 7% during her next visit.     - Specific targets for  A1c;  LDL, HDL, Triglycerides, were discussed with the patient.  2) Blood Pressure /Hypertension:  -Her blood pressure is controlled to target.  She is not on antihypertensive medications currently.  3) Lipids/Hyperlipidemia: Her previsit lipid panel improved to 99 from 124.    This is likely the result of her Crestor.  She is advised to continue Crestor 20 mg p.o. nightly.  The above detailed whole food plant-based diet will also help with dyslipidemia.   4)  Weight/Diet:  Body mass index is 23.39 kg/m.  -   she is not a candidate for major  weight loss.  5-10 pounds of weight loss will help her metabolic profile.  She is advised on exercise, and detailed carbohydrates information provided  -  detailed on discharge instructions.   5) vitamin D deficiency: She is now vitamin D replete at 37.  She is advised to continue multivitamin supplement once a day.    Regarding her history of documented mild hypercalcemia, her subsequent workup is negative for primary hyperparathyroidism.  Her diabetes foot exam today is negative for neuropathy nor peripheral arterial disease.     - she is  advised to maintain close follow up with Wonda Cheng, FNP for primary care needs, as well as her other providers for optimal and coordinated care.  I spent  26  minutes in the care of the patient today including review of labs from CMP, Lipids, Thyroid Function, Hematology (current and previous including abstractions from other facilities); face-to-face time discussing  her blood glucose readings/logs, discussing hypoglycemia and hyperglycemia episodes  and symptoms, medications doses, her options of short and long term treatment based on the latest standards of care / guidelines;  discussion about incorporating lifestyle medicine;  and documenting the encounter. Risk reduction counseling performed per USPSTF guidelines to reduce cardiovascular risk factors.     Please refer to Patient Instructions for Blood Glucose Monitoring and Insulin/Medications Dosing Guide"  in media tab for additional information. Please  also refer to " Patient Self Inventory" in the Media  tab for reviewed elements of pertinent patient history.  Ambrielle R Villagomez participated in the discussions, expressed understanding, and voiced agreement with the above plans.  All questions were answered to her satisfaction. she is encouraged to contact clinic should she have any questions or concerns prior to her return visit.   Follow up plan: - Return in about 6 months (around 04/09/2023) for Fasting Labs  in AM B4 8, A1c -NV.  Marquis Lunch, MD [facility] [address] [place], [place] [address] Phone: [phone]  Fax: [phone]    10/08/2022, 6:14 PM  This note was partially dictated with voice recognition software. Similar sounding words can be transcribed inadequately or may not  be corrected upon review.

## 2022-10-08 NOTE — Patient Instructions (Signed)

## 2022-12-04 ENCOUNTER — Other Ambulatory Visit: Payer: Self-pay

## 2022-12-04 MED ORDER — ROSUVASTATIN CALCIUM 20 MG PO TABS: ORAL_TABLET | ORAL | 1 refills | Status: DC

## 2022-12-04 MED ORDER — METFORMIN HCL ER 500 MG PO TB24: 500.0000 mg | ORAL_TABLET | Freq: Two times a day (BID) | ORAL | 1 refills | Status: DC

## 2023-01-08 ENCOUNTER — Telehealth: Payer: Self-pay | Admitting: Internal Medicine

## 2023-01-08 MED ORDER — PANTOPRAZOLE SODIUM 40 MG PO TBEC: DELAYED_RELEASE_TABLET | ORAL | 0 refills | Status: DC

## 2023-01-08 NOTE — Telephone Encounter (Signed)
Patient called requesting a refill on Protonix. Please advise

## 2023-01-08 NOTE — Telephone Encounter (Signed)
Refilled the prescription but patient needs an office visit for any further refills.

## 2023-03-25 ENCOUNTER — Telehealth: Payer: Self-pay | Admitting: Internal Medicine

## 2023-03-25 MED ORDER — PANTOPRAZOLE SODIUM 40 MG PO TBEC: DELAYED_RELEASE_TABLET | ORAL | 1 refills | Status: DC

## 2023-03-25 NOTE — Telephone Encounter (Signed)
Pantoprazole refilled. 

## 2023-03-25 NOTE — Telephone Encounter (Signed)
Inbound call from patient needing medication refill for pantoprazole.   [facility] in [place]   [phone]  Patient is scheduled for next available ov with provider.   Please advise.

## 2023-04-03 LAB — BASIC METABOLIC PANEL
BUN: 15 (ref 4–21)
Creatinine: 0.6 (ref 0.5–1.1)

## 2023-04-09 ENCOUNTER — Ambulatory Visit: Payer: BC Managed Care – PPO | Admitting: "Endocrinology

## 2023-04-10 LAB — LIPID PANEL
Cholesterol: 189 (ref 0–200)
EGFR: 104
HDL: 56 (ref 35–70)
LDL Cholesterol: 114
Triglycerides: 106 (ref 40–160)

## 2023-04-17 ENCOUNTER — Encounter: Payer: Self-pay | Admitting: "Endocrinology

## 2023-04-17 ENCOUNTER — Ambulatory Visit (INDEPENDENT_AMBULATORY_CARE_PROVIDER_SITE_OTHER): Payer: BC Managed Care – PPO | Admitting: "Endocrinology

## 2023-04-17 VITALS — BP 104/72 | HR 72 | Ht 69.0 in | Wt 150.0 lb

## 2023-04-17 DIAGNOSIS — E559 Vitamin D deficiency, unspecified: Secondary | ICD-10-CM | POA: Diagnosis not present

## 2023-04-17 DIAGNOSIS — Z7984 Long term (current) use of oral hypoglycemic drugs: Secondary | ICD-10-CM | POA: Diagnosis not present

## 2023-04-17 DIAGNOSIS — E782 Mixed hyperlipidemia: Secondary | ICD-10-CM | POA: Diagnosis not present

## 2023-04-17 DIAGNOSIS — E119 Type 2 diabetes mellitus without complications: Secondary | ICD-10-CM

## 2023-04-17 LAB — POCT GLYCOSYLATED HEMOGLOBIN (HGB A1C): HbA1c, POC (controlled diabetic range): 6.6 % (ref 0.0–7.0)

## 2023-04-17 NOTE — Progress Notes (Signed)
04/17/2023, 4:45 PM  Endocrinology follow-up note   Subjective:    Patient ID: Carolyn Porter, female    DOB: 1965/12/26.  Carolyn Porter is being seen in follow-up after she was seen in consultation for management of currently controlled asymptomatic type 2 diabetes requested by  Wonda Cheng, FNP. She has comorbid hyperlipidemia currently on statins.  See below.  Past Medical History:  Diagnosis Date   Allergy    Arthritis    Breast cancer (HCC)    Colon polyp    Gastric irritation    GERD (gastroesophageal reflux disease)    Hyperlipidemia    Internal hemorrhoid     Past Surgical History:  Procedure Laterality Date   ABDOMINAL HYSTERECTOMY     BREAST LUMPECTOMY     COLONOSCOPY     portacath removal     RECTAL POLYPECTOMY      Social History   Socioeconomic History   Marital status: Married    Spouse name: Not on file   Number of children: 2   Years of education: Not on file   Highest education level: Not on file  Occupational History   Occupation: RN  Tobacco Use   Smoking status: Never   Smokeless tobacco: Never  Substance and Sexual Activity   Alcohol use: Yes    Alcohol/week: 0.0 standard drinks of alcohol    Comment: occ   Drug use: No   Sexual activity: Yes    Birth control/protection: Surgical  Other Topics Concern   Not on file  Social History Narrative   Not on file   Social Determinants of Health   Financial Resource Strain: Not on file  Food Insecurity: Not on file  Transportation Needs: Not on file  Physical Activity: Not on file  Stress: Not on file  Social Connections: Not on file    Family History  Problem Relation Age of Onset   Diabetes Mother    Diabetes Maternal Grandmother    Colon cancer Neg Hx    Esophageal cancer Neg Hx    Rectal cancer Neg Hx    Stomach cancer Neg Hx     Outpatient Encounter Medications as of 04/17/2023  Medication Sig    Cholecalciferol (VITAMIN D) 50 MCG (2000 UT) CAPS Take 2,000 Units by mouth daily with breakfast.   Accu-Chek FastClix Lancets MISC TEST BLOOD GLUCOSE TWICE DAILY   Ascorbic Acid (VITAMIN C) 250 MG CHEW Chew 1-2 tablets by mouth daily.   azelastine (ASTELIN) 0.1 % nasal spray Place into both nostrils 2 (two) times daily. Use in each nostril as directed   b complex vitamins capsule Take 1 capsule by mouth daily.   co-enzyme Q-10 30 MG capsule Take 30 mg by mouth daily.    famotidine (PEPCID) 20 MG tablet Take 1 tablet (20 mg total) by mouth at bedtime. (Patient taking differently: Take 20 mg by mouth daily as needed.)   fluticasone (FLONASE) 50 MCG/ACT nasal spray Place 2 sprays into both nostrils as needed.    glucose blood (ACCU-CHEK GUIDE) test strip USE TO TEST BLOOD GLUCOSE TWICE DAILY   loperamide (IMODIUM A-D) 2 MG tablet Take 2 mg by mouth as needed  for diarrhea or loose stools.   metFORMIN (GLUCOPHAGE-XR) 500 MG 24 hr tablet Take 1 tablet (500 mg total) by mouth 2 (two) times daily with a meal.   Multiple Vitamin (MULTIVITAMIN) tablet Take 2 tablets by mouth daily.   pantoprazole (PROTONIX) 40 MG tablet TAKE 1 TABLET BY MOUTH EVERY DAY(NEED OFFICE VISIT FOR FURTHER REFILLS)   rosuvastatin (CRESTOR) 20 MG tablet TAKE 1 TABLET(20 MG) BY MOUTH AT BEDTIME   Zinc 25 MG TABS Take 1 tablet by mouth daily.   [DISCONTINUED] aspirin EC 81 MG tablet Take 81 mg by mouth daily.   No facility-administered encounter medications on file as of 04/17/2023.    ALLERGIES: Allergies  Allergen Reactions   Erythromycin Anaphylaxis    Patient allergic to all of the mycin medications    VACCINATION STATUS:  There is no immunization history on file for this patient.  Diabetes She presents for her follow-up diabetic visit. She has type 2 diabetes mellitus. Onset time: She was diagnosed at approximate age of 12 years. Her disease course has been stable. There are no hypoglycemic associated symptoms.  Pertinent negatives for hypoglycemia include no confusion, headaches, pallor or seizures. There are no diabetic associated symptoms. Pertinent negatives for diabetes include no chest pain, no polydipsia, no polyphagia and no polyuria. There are no hypoglycemic complications. Symptoms are stable. There are no diabetic complications. Risk factors for coronary artery disease include diabetes mellitus, dyslipidemia and post-menopausal. Current diabetic treatments: Metformin 500 mg p.o. twice daily. Her weight is decreasing steadily (She has achieved a weight loss of 30 pounds overall.). She has not had a previous visit with a dietitian. Her home blood glucose trend is decreasing steadily. (She presents with stable glycemic profile with point-of-care A1c of 6.6%.    She continues to metformin 500 mg p.o. twice daily. ) An ACE inhibitor/angiotensin II receptor blocker is not being taken. Eye exam is current.  Hyperlipidemia This is a chronic problem. The current episode started more than 1 year ago. The problem is uncontrolled. Exacerbating diseases include diabetes. Pertinent negatives include no chest pain, myalgias or shortness of breath. Current antihyperlipidemic treatment includes statins (She is consistent taking Crestor 20 mg p.o. nightly.). Risk factors for coronary artery disease include dyslipidemia and diabetes mellitus.    Review of Systems  Constitutional:  Negative for chills, fever and unexpected weight change.  HENT:  Negative for trouble swallowing and voice change.   Eyes:  Negative for visual disturbance.  Respiratory:  Negative for cough, shortness of breath and wheezing.   Cardiovascular:  Negative for chest pain, palpitations and leg swelling.  Gastrointestinal:  Negative for diarrhea, nausea and vomiting.  Endocrine: Negative for cold intolerance, heat intolerance, polydipsia, polyphagia and polyuria.  Musculoskeletal:  Negative for arthralgias and myalgias.  Skin:  Negative for  color change, pallor, rash and wound.  Neurological:  Negative for seizures and headaches.  Psychiatric/Behavioral:  Negative for confusion and suicidal ideas.     Objective:    BP 104/72   Pulse 72   Ht 5\' 9"  (1.753 m)   Wt 150 lb (68 kg)   BMI 22.15 kg/m   Wt Readings from Last 3 Encounters:  04/17/23 150 lb (68 kg)  10/08/22 158 lb 6.4 oz (71.8 kg)  06/04/22 164 lb 9.6 oz (74.7 kg)       CMP ( most recent) CMP     Component Value Date/Time   NA 141 05/23/2022 0000   K 4.4 05/23/2022 0000   CL 105  05/23/2022 0000   CO2 23 (A) 05/23/2022 0000   GLUCOSE 152 (H) 02/15/2022 0942   GLUCOSE 105 (H) 03/15/2012 1715   BUN 15 04/03/2023 0000   CREATININE 0.6 04/03/2023 0000   CREATININE 0.73 02/15/2022 0942   CALCIUM 10.0 10/02/2022 1037   PROT 7.0 02/15/2022 0942   ALBUMIN 4.7 05/23/2022 0000   ALBUMIN 4.5 02/15/2022 0942   AST 21 05/23/2022 0000   ALT 22 05/23/2022 0000   ALKPHOS 98 05/23/2022 0000   BILITOT 0.3 02/15/2022 0942   GFRNONAA 81 04/05/2020 0000   GFRAA 93 04/05/2020 0000    Diabetic Labs (most recent): Lab Results  Component Value Date   HGBA1C 6.6 04/17/2023   HGBA1C 6.5 10/08/2022   HGBA1C 7.3 05/23/2022    Lab Results  Component Value Date   TSH 3.17 05/23/2022   TSH 3.980 02/15/2022   TSH 0.47 04/18/2021   TSH 1.30 10/27/2020   TSH 2.88 04/05/2020   TSH 1.85 01/06/2019   FREET4 1.05 02/15/2022    Lipid Panel     Component Value Date/Time   CHOL 189 04/03/2023 0000   CHOL 165 10/02/2022 1037   TRIG 106 04/03/2023 0000   HDL 56 04/03/2023 0000   HDL 49 10/02/2022 1037   CHOLHDL 3.4 10/02/2022 1037   LDLCALC 114 04/03/2023 0000   LDLCALC 99 10/02/2022 1037   LABVLDL 17 10/02/2022 1037     Assessment & Plan:   1. Uncontrolled type 2 diabetes mellitus with hyperglycemia (HCC)  - Carolyn Porter has currently uncontrolled symptomatic type 2 DM since  57 years of age.  She presents with stable glycemic profile with point-of-care  A1c of 6.6%.    She continues to metformin 500 mg p.o. twice daily.  - Recent labs reviewed. - I had a long discussion with her about the progressive nature of diabetes and the pathology behind its complications. -She does not report any complications, however,  she remains at a high risk for acute and chronic complications which include CAD, CVA, CKD, retinopathy, and neuropathy. These are all discussed in detail with her.  - she has engaged in lifestyle medicine and benefiting from it.    - she acknowledges that there is a room for improvement in her food and drink choices. - Suggestion is made for her to avoid simple carbohydrates  from her diet including Cakes, Sweet Desserts, Ice Cream, Soda (diet and regular), Sweet Tea, Candies, Chips, Cookies, Store Bought Juices, Alcohol , Artificial Sweeteners,  Coffee Creamer, and "Sugar-free" Products, Lemonade. This will help patient to have more stable blood glucose profile and potentially avoid unintended weight gain.  The following Lifestyle Medicine recommendations according to American College of Lifestyle Medicine  [employer] [facility]) were discussed and and offered to patient and she  agrees to start the journey:  A. Whole Foods, Plant-Based Nutrition comprising of fruits and vegetables, plant-based proteins, whole-grain carbohydrates was discussed in detail with the patient.   A list for source of those nutrients were also provided to the patient.  Patient will use only water or unsweetened tea for hydration. B.  The need to stay away from risky substances including alcohol, smoking; obtaining 7 to 9 hours of restorative sleep, at least 150 minutes of moderate intensity exercise weekly, the importance of healthy social connections,  and stress management techniques were discussed. C.  A full color page of  Calorie density of various food groups per pound showing examples of each food groups was provided to the patient.   -  I have approached her with the  following individualized plan to manage  her diabetes and patient agrees:  -She was approached with low-dose SGLT2 inhibitors, however she would like to research more on his medications.  In the meantime, she is advised to continue metformin 500 mg p.o. twice daily.    -She has the option of going on Trulicity/Ozempic weekly , however patient wishes to avoid any medication escalation at this time.    - Specific targets for  A1c;  LDL, HDL, Triglycerides, were discussed with the patient.  2) Blood Pressure /Hypertension:  -Her blood pressure is controlled to target.  She is not on antihypertensive medications currently.  3) Lipids/Hyperlipidemia: Her previsit lipid panel increasing back to 114 from 99.  She is advised to be consistent Crestor 20 mg p.o. nightly.  The above detailed whole food plant-based diet will also help with dyslipidemia.   4)  Weight/Diet:  Body mass index is 22.15 kg/m.  -She has achieved 30 pounds of weight loss since last year.  she is not a candidate for major  weight loss.  5-10 pounds of weight loss will help her metabolic profile.  She is advised on exercise, and detailed carbohydrates information provided  -  detailed on discharge instructions.   5) vitamin D deficiency: She is now vitamin D replete at 72.  She is advised to continue multivitamin supplement once a day.    Regarding her history of documented mild hypercalcemia, her subsequent workup is negative for primary hyperparathyroidism.  Her diabetes foot exam today is negative for neuropathy nor peripheral arterial disease.     - she is  advised to maintain close follow up with Wonda Cheng, FNP for primary care needs, as well as her other providers for optimal and coordinated care.  I spent  26  minutes in the care of the patient today including review of labs from CMP, Lipids, Thyroid Function, Hematology (current and previous including abstractions from other facilities); face-to-face time discussing   her blood glucose readings/logs, discussing hypoglycemia and hyperglycemia episodes and symptoms, medications doses, her options of short and long term treatment based on the latest standards of care / guidelines;  discussion about incorporating lifestyle medicine;  and documenting the encounter. Risk reduction counseling performed per USPSTF guidelines to reduce  obesity and cardiovascular risk factors.     Please refer to Patient Instructions for Blood Glucose Monitoring and Insulin/Medications Dosing Guide"  in media tab for additional information. Please  also refer to " Patient Self Inventory" in the Media  tab for reviewed elements of pertinent patient history.  Carolyn Porter participated in the discussions, expressed understanding, and voiced agreement with the above plans.  All questions were answered to her satisfaction. she is encouraged to contact clinic should she have any questions or concerns prior to her return visit.    Follow up plan: - Return in about 6 months (around 10/16/2023) for Fasting Labs  in AM B4 8, A1c -NV.  Marquis Lunch, MD [facility] [address] [place], [place] [address] Phone: [phone]  Fax: [phone]    04/17/2023, 4:45 PM  This note was partially dictated with voice recognition software. Similar sounding words can be transcribed inadequately or may not  be corrected upon review.

## 2023-04-17 NOTE — Patient Instructions (Signed)

## 2023-06-06 ENCOUNTER — Other Ambulatory Visit: Payer: Self-pay | Admitting: "Endocrinology

## 2023-06-23 ENCOUNTER — Other Ambulatory Visit: Payer: Self-pay | Admitting: "Endocrinology

## 2023-07-16 ENCOUNTER — Encounter: Payer: Self-pay | Admitting: Internal Medicine

## 2023-07-16 ENCOUNTER — Ambulatory Visit: Payer: BC Managed Care – PPO | Admitting: Internal Medicine

## 2023-07-16 VITALS — BP 126/70 | HR 100 | Ht 69.0 in | Wt 155.8 lb

## 2023-07-16 DIAGNOSIS — K219 Gastro-esophageal reflux disease without esophagitis: Secondary | ICD-10-CM | POA: Diagnosis not present

## 2023-07-16 DIAGNOSIS — Z8719 Personal history of other diseases of the digestive system: Secondary | ICD-10-CM

## 2023-07-16 DIAGNOSIS — K52832 Lymphocytic colitis: Secondary | ICD-10-CM

## 2023-07-16 MED ORDER — PANTOPRAZOLE SODIUM 40 MG PO TBEC: DELAYED_RELEASE_TABLET | ORAL | 3 refills | Status: AC

## 2023-07-16 NOTE — Patient Instructions (Addendum)
We have sent the following medications to your pharmacy for you to pick up at your convenience:  Pantoprazole.  Please follow up in one year.  _______________________________________________________  If your blood pressure at your visit was 140/90 or greater, please contact your primary care physician to follow up on this.  _______________________________________________________  If you are age 59 or older, your body mass index should be between 23-30. Your Body mass index is 23.01 kg/m. If this is out of the aforementioned range listed, please consider follow up with your Primary Care Provider.  If you are age 23 or younger, your body mass index should be between 19-25. Your Body mass index is 23.01 kg/m. If this is out of the aformentioned range listed, please consider follow up with your Primary Care Provider.   ________________________________________________________  The [facility] providers would like to encourage you to use [facility] to communicate with providers for non-urgent requests or questions.  Due to long hold times on the telephone, sending your provider a message by [facility] may be a faster and more efficient way to get a response.  Please allow 48 business hours for a response.  Please remember that this is for non-urgent requests.  _______________________________________________________

## 2023-07-16 NOTE — Progress Notes (Signed)
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  She was last evaluated August 27, 2021.  See that dictation for details.  Impression and plan from that encounter as follows: ASSESSMENT:   1.  History of lymphocytic colitis.  Currently off medical therapy.  Bowel habits are reported as acceptable with 3 or 4 mostly formed bowel movements daily.  Using Imodium as needed 2.  GERD.  On pantoprazole and H2 receptor antagonist therapy as described.  Prior endoscopy elsewhere 2013 with esophagitis and hiatal hernia 3.  Colonoscopy September 2021 as described   PLAN:   1.  Reflux precautions 2.  Refill pantoprazole 40 mg daily.  Medication risks reviewed 3.  Refill Pepcid.  Medication risks reviewed 4.  Imodium as needed 5.  Routine office follow-up in 1 year.  Contact the office in the interim for any questions or problems. 6.  Routine repeat screening colonoscopy 2031  Patient tells me that she is doing fine.  She does have days with frequent bowel movements, particular in the morning, but not diarrhea.  She has been using Imodium as needed.  This helps.  She does not feel that her current bowel habits are reminiscent of her issues with lymphocytic colitis.  In terms of GERD, mostly managed with pantoprazole once daily.  Occasional H2 receptor antagonist at night.  No new complaints.  She is up-to-date on screening colonoscopy  REVIEW OF SYSTEMS:  All non-GI ROS negative.  Past Medical History:  Diagnosis Date   Allergy    Arthritis    Breast cancer (HCC)    Colon polyp    Gastric irritation    GERD (gastroesophageal reflux disease)    Hyperlipidemia    Internal hemorrhoid     Past Surgical History:  Procedure Laterality Date   ABDOMINAL HYSTERECTOMY     BREAST LUMPECTOMY     COLONOSCOPY     portacath removal      RECTAL POLYPECTOMY      Social History Carolyn Porter  reports that she has never smoked. She has never used smokeless tobacco. She reports current alcohol use. She reports that she does not use drugs.  family history includes Diabetes in her maternal grandmother and mother.  Allergies  Allergen Reactions   Erythromycin Anaphylaxis    Patient allergic to all of the mycin medications       PHYSICAL EXAMINATION: Vital signs: BP 126/70   Pulse 100   Ht 5\' 9"  (1.753 m)   Wt 155 lb 12.8 oz (70.7 kg)   BMI 23.01 kg/m   Constitutional: generally well-appearing, no acute distress Psychiatric: alert and oriented x3, cooperative Eyes: extraocular movements intact, anicteric, conjunctiva pink Mouth: oral pharynx moist, no lesions Neck: supple no lymphadenopathy Cardiovascular: heart regular rate and rhythm, no murmur Lungs: clear to auscultation bilaterally Abdomen: soft, nontender, nondistended, no obvious ascites, no peritoneal signs, normal bowel sounds, no organomegaly Rectal: Omitted Extremities: no clubbing, cyanosis, or lower extremity edema bilaterally Skin: no lesions on visible extremities Neuro: No focal deficits.  Cranial nerves intact  ASSESSMENT:   1.  History of lymphocytic colitis.  Currently off medical therapy.  Bowel habits are reported as acceptable with 3 or 4 mostly formed bowel movements daily.  Using Imodium as needed 2.  GERD.  On pantoprazole and H2 receptor antagonist therapy as described 3.  Colonoscopy September  2021 as described     PLAN:   1.  Reflux precautions 2.  Refill pantoprazole 40 mg daily.  Medication risks reviewed 3.  Continue Pepcid at night as needed.  She does not need a refill at this time 4.  Imodium as needed 5.  Routine office follow-up in 1 year.  Contact the office in the interim for any questions or problems. 6.  Routine repeat screening colonoscopy 2031 Total time of 30 minutes was spent preparing to see the patient,  reviewing data, obtaining comprehensive history, performing medically appropriate physical examination, counseling the patient and educating the patient regarding the above listed issues, ordering medications, establishing follow-up interval, and documenting clinical information in the health record

## 2023-10-28 ENCOUNTER — Ambulatory Visit: Payer: BC Managed Care – PPO | Admitting: "Endocrinology

## 2023-11-05 LAB — LAB REPORT - SCANNED
A1c: 7
EGFR: 103
PTH, Intact: 82
TSH: 2.72 (ref 0.41–5.90)

## 2023-11-24 ENCOUNTER — Encounter: Payer: Self-pay | Admitting: "Endocrinology

## 2023-11-24 ENCOUNTER — Ambulatory Visit (INDEPENDENT_AMBULATORY_CARE_PROVIDER_SITE_OTHER): Admitting: "Endocrinology

## 2023-11-24 VITALS — BP 116/66 | HR 68 | Ht 69.0 in | Wt 158.4 lb

## 2023-11-24 DIAGNOSIS — Z7985 Long-term (current) use of injectable non-insulin antidiabetic drugs: Secondary | ICD-10-CM | POA: Diagnosis not present

## 2023-11-24 DIAGNOSIS — E119 Type 2 diabetes mellitus without complications: Secondary | ICD-10-CM

## 2023-11-24 DIAGNOSIS — E782 Mixed hyperlipidemia: Secondary | ICD-10-CM | POA: Diagnosis not present

## 2023-11-24 DIAGNOSIS — E559 Vitamin D deficiency, unspecified: Secondary | ICD-10-CM

## 2023-11-24 MED ORDER — ACCU-CHEK GUIDE TEST VI STRP: ORAL_STRIP | 1 refills | Status: AC

## 2023-11-24 MED ORDER — ROSUVASTATIN CALCIUM 20 MG PO TABS: 20.0000 mg | ORAL_TABLET | Freq: Every day | ORAL | 1 refills | Status: AC

## 2023-11-24 NOTE — Progress Notes (Signed)
 11/24/2023, 6:54 PM  Endocrinology follow-up note   Subjective:    Patient ID: Carolyn Porter, female    DOB: [birth year].  Carolyn Porter is being seen in follow-up after she was seen in consultation for management of currently controlled asymptomatic type 2 diabetes requested by  Terrye Fiedler, FNP. She has comorbid hyperlipidemia currently on statins.  See below.  Past Medical History:  Diagnosis Date   Allergy    Arthritis    Breast cancer (HCC)    Colon polyp    Gastric irritation    GERD (gastroesophageal reflux disease)    Hyperlipidemia    Internal hemorrhoid     Past Surgical History:  Procedure Laterality Date   ABDOMINAL HYSTERECTOMY     BREAST LUMPECTOMY     COLONOSCOPY     portacath removal     RECTAL POLYPECTOMY      Social History   Socioeconomic History   Marital status: Married    Spouse name: Not on file   Number of children: 2   Years of education: Not on file   Highest education level: Not on file  Occupational History   Occupation: RN  Tobacco Use   Smoking status: Never   Smokeless tobacco: Never  Substance and Sexual Activity   Alcohol use: Yes    Alcohol/week: 0.0 standard drinks of alcohol    Comment: occ   Drug use: No   Sexual activity: Yes    Birth control/protection: Surgical  Other Topics Concern   Not on file  Social History Narrative   Not on file   Social Drivers of Health   Financial Resource Strain: Not on file  Food Insecurity: Not on file  Transportation Needs: Not on file  Physical Activity: Not on file  Stress: Not on file  Social Connections: Not on file    Family History  Problem Relation Age of Onset   Diabetes Mother    Diabetes Maternal Grandmother    Colon cancer Neg Hx    Esophageal cancer Neg Hx    Rectal cancer Neg Hx    Stomach cancer Neg Hx     Outpatient Encounter Medications as of 11/24/2023  Medication Sig   glucose  blood (ACCU-CHEK GUIDE TEST) test strip Use to monitor glucose once daily as instructed   Accu-Chek FastClix Lancets MISC TEST BLOOD GLUCOSE TWICE DAILY   Ascorbic Acid (VITAMIN C) 250 MG CHEW Chew 1-2 tablets by mouth daily. (Patient not taking: Reported on 11/24/2023)   azelastine (ASTELIN) 0.1 % nasal spray Place into both nostrils 2 (two) times daily. Use in each nostril as directed   b complex vitamins capsule Take 1 capsule by mouth daily.   Cholecalciferol (VITAMIN D ) 50 MCG (2000 UT) CAPS Take 2,000 Units by mouth every other day.   co-enzyme Q-10 30 MG capsule Take 30 mg by mouth daily.    famotidine  (PEPCID ) 20 MG tablet Take 1 tablet (20 mg total) by mouth at bedtime. (Patient taking differently: Take 20 mg by mouth daily as needed.)   fluticasone (FLONASE) 50 MCG/ACT nasal spray Place 2 sprays into both nostrils as needed.    loperamide (IMODIUM A-D) 2 MG  tablet Take 2 mg by mouth as needed for diarrhea or loose stools.   metFORMIN  (GLUCOPHAGE -XR) 500 MG 24 hr tablet TAKE 1 TABLET BY MOUTH TWICE DAILY WITH MEALS   Multiple Vitamin (MULTIVITAMIN) tablet Take by mouth daily. Nutrafol 2-4 tablets daily   pantoprazole  (PROTONIX ) 40 MG tablet TAKE 1 TABLET BY MOUTH EVERY DAY   rosuvastatin  (CRESTOR ) 20 MG tablet Take 1 tablet (20 mg total) by mouth at bedtime.   Zinc 25 MG TABS Take 1 tablet by mouth daily.   [DISCONTINUED] glucose blood (ACCU-CHEK GUIDE) test strip USE TO TEST BLOOD GLUCOSE TWICE DAILY   [DISCONTINUED] rosuvastatin  (CRESTOR ) 20 MG tablet TAKE ONE TABLET BY MOUTH DAILY AT BEDTIME   No facility-administered encounter medications on file as of 11/24/2023.    ALLERGIES: Allergies  Allergen Reactions   Erythromycin Anaphylaxis    Patient allergic to all of the mycin medications    VACCINATION STATUS:  There is no immunization history on file for this patient.  Diabetes She presents for her follow-up diabetic visit. She has type 2 diabetes mellitus. Onset time: She was  diagnosed at approximate age of 48 years. Her disease course has been worsening. There are no hypoglycemic associated symptoms. Pertinent negatives for hypoglycemia include no confusion, headaches, pallor or seizures. There are no diabetic associated symptoms. Pertinent negatives for diabetes include no chest pain, no polydipsia, no polyphagia and no polyuria. There are no hypoglycemic complications. Symptoms are stable. There are no diabetic complications. Risk factors for coronary artery disease include diabetes mellitus, dyslipidemia and post-menopausal. Current diabetic treatments: Metformin  500 mg p.o. twice daily. Her weight is increasing steadily. She has not had a previous visit with a dietitian. Her home blood glucose trend is increasing steadily. (She presents with stable glycemic profile with pre-visit  A1c of 7% increasing from 6.6%.    She continues to metformin  500 mg p.o. twice daily. ) An ACE inhibitor/angiotensin II receptor blocker is not being taken. Eye exam is current.  Hyperlipidemia This is a chronic problem. The current episode started more than 1 year ago. The problem is uncontrolled. Exacerbating diseases include diabetes. Pertinent negatives include no chest pain, myalgias or shortness of breath. Current antihyperlipidemic treatment includes statins (She is consistent taking Crestor  20 mg p.o. nightly.). Risk factors for coronary artery disease include dyslipidemia and diabetes mellitus.      Objective:    BP 116/66   Pulse 68   Ht 5\' 9"  (1.753 m)   Wt 158 lb 6.4 oz (71.8 kg)   BMI 23.39 kg/m   Wt Readings from Last 3 Encounters:  11/24/23 158 lb 6.4 oz (71.8 kg)  07/16/23 155 lb 12.8 oz (70.7 kg)  04/17/23 150 lb (68 kg)       CMP ( most recent) CMP     Component Value Date/Time   NA 141 05/23/2022 0000   K 4.4 05/23/2022 0000   CL 105 05/23/2022 0000   CO2 23 (A) 05/23/2022 0000   GLUCOSE 152 (H) 02/15/2022 0942   GLUCOSE 105 (H) 03/15/2012 1715   BUN 15  04/03/2023 0000   CREATININE 0.6 04/03/2023 0000   CREATININE 0.73 02/15/2022 0942   CALCIUM  10.0 10/02/2022 1037   PROT 7.0 02/15/2022 0942   ALBUMIN 4.7 05/23/2022 0000   ALBUMIN 4.5 02/15/2022 0942   AST 21 05/23/2022 0000   ALT 22 05/23/2022 0000   ALKPHOS 98 05/23/2022 0000   BILITOT 0.3 02/15/2022 0942   GFRNONAA 81 04/05/2020 0000   GFRAA 93  04/05/2020 0000    Diabetic Labs (most recent): Lab Results  Component Value Date   HGBA1C 6.6 04/17/2023   HGBA1C 6.5 10/08/2022   HGBA1C 7.3 05/23/2022    Lab Results  Component Value Date   TSH 2.72 11/05/2023   TSH 3.17 05/23/2022   TSH 3.980 02/15/2022   TSH 0.47 04/18/2021   TSH 1.30 10/27/2020   TSH 2.88 04/05/2020   TSH 1.85 01/06/2019   FREET4 1.05 02/15/2022    Lipid Panel     Component Value Date/Time   CHOL 189 04/09/2023 0000   CHOL 165 10/02/2022 1037   TRIG 106 04/09/2023 0000   HDL 56 04/09/2023 0000   HDL 49 10/02/2022 1037   CHOLHDL 3.4 10/02/2022 1037   LDLCALC 114 04/09/2023 0000   LDLCALC 99 10/02/2022 1037   LABVLDL 17 10/02/2022 1037     Assessment & Plan:   1. Uncontrolled type 2 diabetes mellitus with hyperglycemia (HCC)  - Carolyn Porter has currently uncontrolled symptomatic type 2 DM since  58 years of age.  She presents with stable glycemic profile with pre-visit  A1c of 7% increasing from 6.6%.    She continues to metformin  500 mg p.o. twice daily.  - Recent labs reviewed. - I had a long discussion with her about the progressive nature of diabetes and the pathology behind its complications. -She does not report any complications, however,  she remains at a high risk for acute and chronic complications which include CAD, CVA, CKD, retinopathy, and neuropathy. These are all discussed in detail with her.  - she acknowledges that there is a room for improvement in her food and drink choices. - Suggestion is made for her to avoid simple carbohydrates  from her diet including Cakes,  Sweet Desserts, Ice Cream, Soda (diet and regular), Sweet Tea, Candies, Chips, Cookies, Store Bought Juices, Alcohol , Artificial Sweeteners,  Coffee Creamer, and "Sugar-free" Products, Lemonade. This will help patient to have more stable blood glucose profile and potentially avoid unintended weight gain.  The following Lifestyle Medicine recommendations according to American College of Lifestyle Medicine  [facility]) were discussed and and offered to patient and she  agrees to start the journey:  A. Whole Foods, Plant-Based Nutrition comprising of fruits and vegetables, plant-based proteins, whole-grain carbohydrates was discussed in detail with the patient.   A list for source of those nutrients were also provided to the patient.  Patient will use only water or unsweetened tea for hydration. B.  The need to stay away from risky substances including alcohol, smoking; obtaining 7 to 9 hours of restorative sleep, at least 150 minutes of moderate intensity exercise weekly, the importance of healthy social connections,  and stress management techniques were discussed. C.  A full color page of  Calorie density of various food groups per pound showing examples of each food groups was provided to the patient.   - I have approached her with the following individualized plan to manage  her diabetes and patient agrees:  -She was approached with low-dose SGLT2 inhibitors, however she would like to postpone intervention with these medications for now.  This is acceptable given the upcoming season encouraged to exercise.  She is advised to continue  metformin  500 mg p.o. twice daily.    -She has the option of going on Trulicity/Ozempic  weekly , however patient wishes to avoid any medication escalation at this time.    - Specific targets for  A1c;  LDL, HDL, Triglycerides, were discussed with  the patient.  2) Blood Pressure /Hypertension:  Her blood pressure is controlled to target.  She is not on antihypertensive  medications currently.  3) Lipids/Hyperlipidemia: Her previsit lipid panel showed improved LDL to 103 from 114.  She is advised to continue Crestor  20 mg p.o. nightly.   The above detailed whole food plant-based diet will also help with dyslipidemia.   4)  Weight/Diet:  Body mass index is 23.39 kg/m.  -She has achieved 30 pounds of weight loss since last year.  she is not a candidate for major  weight loss.  5-10 pounds of weight loss will help her metabolic profile.  She is advised on exercise, and detailed carbohydrates information provided  -  detailed on discharge instructions.   5) vitamin D  deficiency: She is taking excessive amount of vitamin D  and calcium  from vitamin supplements.  She is advised to discontinue vitamin D  and lower her multivitamin supplements to half dose.  She is vitamin D  replete at 57 with mild hypercalcemia.  She will have PTH/calcium  next visit.  In light of her previously documented mild hypercalcemia, if she returns with persistent hypercalcemia, she will be considered for 24-hour urine calcium  measurement.   - she is  advised to maintain close follow up with Terrye Fiedler, FNP for primary care needs, as well as her other providers for optimal and coordinated care.   I spent  26  minutes in the care of the patient today including review of labs from Thyroid Function, CMP, and other relevant labs ; imaging/biopsy records (current and previous including abstractions from other facilities); face-to-face time discussing  her lab results and symptoms, medications doses, her options of short and long term treatment based on the latest standards of care / guidelines;   and documenting the encounter.  Carolyn Porter  participated in the discussions, expressed understanding, and voiced agreement with the above plans.  All questions were answered to her satisfaction. she is encouraged to contact clinic should she have any questions or concerns prior to her return  visit.   Follow up plan: - Return in about 6 months (around 05/25/2024) for Fasting Labs  in AM B4 8, A1c -NV.  Kalvin Orf, MD [facility] [address] [place], [place] [address] Phone: [phone]  Fax: [phone]    11/24/2023, 6:54 PM  This note was partially dictated with voice recognition software. Similar sounding words can be transcribed inadequately or may not  be corrected upon review.

## 2023-11-24 NOTE — Patient Instructions (Signed)

## 2023-12-08 ENCOUNTER — Other Ambulatory Visit: Payer: Self-pay | Admitting: "Endocrinology

## 2024-05-19 ENCOUNTER — Telehealth: Payer: Self-pay | Admitting: "Endocrinology

## 2024-05-19 NOTE — Telephone Encounter (Signed)
 error

## 2024-05-28 ENCOUNTER — Ambulatory Visit: Admitting: "Endocrinology

## 2024-05-28 LAB — COMPREHENSIVE METABOLIC PANEL WITH GFR
ALT: 28 IU/L (ref 0–32)
AST: 23 IU/L (ref 0–40)
Albumin: 4.5 g/dL (ref 3.8–4.9)
Alkaline Phosphatase: 144 IU/L — ABNORMAL HIGH (ref 49–135)
BUN/Creatinine Ratio: 19 (ref 9–23)
BUN: 13 mg/dL (ref 6–24)
Bilirubin Total: 0.3 mg/dL (ref 0.0–1.2)
CO2: 22 mmol/L (ref 20–29)
Calcium: 10.9 mg/dL — ABNORMAL HIGH (ref 8.7–10.2)
Chloride: 104 mmol/L (ref 96–106)
Creatinine, Ser: 0.68 mg/dL (ref 0.57–1.00)
Globulin, Total: 2.1 g/dL (ref 1.5–4.5)
Glucose: 128 mg/dL — ABNORMAL HIGH (ref 70–99)
Potassium: 4 mmol/L (ref 3.5–5.2)
Sodium: 143 mmol/L (ref 134–144)
Total Protein: 6.6 g/dL (ref 6.0–8.5)
eGFR: 100 mL/min/1.73 (ref >59)

## 2024-05-28 LAB — LIPID PANEL
Chol/HDL Ratio: 3.2 ratio (ref 0.0–4.4)
Cholesterol, Total: 178 mg/dL (ref 100–199)
HDL: 55 mg/dL (ref >39)
LDL Chol Calc (NIH): 103 mg/dL — ABNORMAL HIGH (ref 0–99)
Triglycerides: 114 mg/dL (ref 0–149)
VLDL Cholesterol Cal: 20 mg/dL (ref 5–40)

## 2024-05-28 LAB — PTH, INTACT AND CALCIUM: PTH: 99 pg/mL — AB (ref 15–65)

## 2024-06-02 ENCOUNTER — Ambulatory Visit: Admitting: "Endocrinology

## 2024-06-02 ENCOUNTER — Encounter: Payer: Self-pay | Admitting: "Endocrinology

## 2024-06-02 VITALS — BP 112/78 | HR 84 | Ht 69.0 in | Wt 160.6 lb

## 2024-06-02 DIAGNOSIS — E559 Vitamin D deficiency, unspecified: Secondary | ICD-10-CM

## 2024-06-02 DIAGNOSIS — E119 Type 2 diabetes mellitus without complications: Secondary | ICD-10-CM

## 2024-06-02 DIAGNOSIS — Z7984 Long term (current) use of oral hypoglycemic drugs: Secondary | ICD-10-CM

## 2024-06-02 DIAGNOSIS — E1165 Type 2 diabetes mellitus with hyperglycemia: Secondary | ICD-10-CM

## 2024-06-02 DIAGNOSIS — E782 Mixed hyperlipidemia: Secondary | ICD-10-CM | POA: Diagnosis not present

## 2024-06-02 LAB — POCT GLYCOSYLATED HEMOGLOBIN (HGB A1C): HbA1c, POC (controlled diabetic range): 7 % (ref 0.0–7.0)

## 2024-06-02 NOTE — Progress Notes (Signed)
 06/02/2024, 11:13 AM  Endocrinology follow-up note   Subjective:    Patient ID: Carolyn Porter, female    DOB: 1965-09-02.  Carolyn Porter is being seen in follow-up after she was seen in consultation for management of currently controlled asymptomatic type 2 diabetes requested by  Liliane Collar, FNP. She has hyperlipidemia currently on statins.  Her previsit labs continue to show mild Qsymia associated with high PTH.  See below.  Past Medical History:  Diagnosis Date   Allergy    Arthritis    Breast cancer (HCC)    Colon polyp    Gastric irritation    GERD (gastroesophageal reflux disease)    Hyperlipidemia    Internal hemorrhoid     Past Surgical History:  Procedure Laterality Date   ABDOMINAL HYSTERECTOMY     BREAST LUMPECTOMY     COLONOSCOPY     portacath removal     RECTAL POLYPECTOMY      Social History   Socioeconomic History   Marital status: Married    Spouse name: Not on file   Number of children: 2   Years of education: Not on file   Highest education level: Not on file  Occupational History   Occupation: RN  Tobacco Use   Smoking status: Never   Smokeless tobacco: Never  Substance and Sexual Activity   Alcohol use: Yes    Alcohol/week: 0.0 standard drinks of alcohol    Comment: occ   Drug use: No   Sexual activity: Yes    Birth control/protection: Surgical  Other Topics Concern   Not on file  Social History Narrative   Not on file   Social Drivers of Health   Financial Resource Strain: Not on file  Food Insecurity: Not on file  Transportation Needs: Not on file  Physical Activity: Not on file  Stress: Not on file  Social Connections: Not on file    Family History  Problem Relation Age of Onset   Diabetes Mother    Diabetes Maternal Grandmother    Colon cancer Neg Hx    Esophageal cancer Neg Hx    Rectal cancer Neg Hx    Stomach cancer Neg Hx      Outpatient Encounter Medications as of 06/02/2024  Medication Sig   Ascorbic Acid (VITAMIN C) 250 MG CHEW Chew 1-2 tablets by mouth daily. (Patient taking differently: Chew 1-2 tablets by mouth daily as needed.)   Zinc 25 MG TABS Take 1 tablet by mouth daily. (Patient taking differently: Take 1 tablet by mouth daily as needed.)   Accu-Chek FastClix Lancets MISC TEST BLOOD GLUCOSE TWICE DAILY   azelastine (ASTELIN) 0.1 % nasal spray Place into both nostrils 2 (two) times daily. Use in each nostril as directed   b complex vitamins capsule Take 1 capsule by mouth daily.   Cholecalciferol (VITAMIN D ) 50 MCG (2000 UT) CAPS Take 2,000 Units by mouth every other day. (Patient not taking: Reported on 06/02/2024)   co-enzyme Q-10 30 MG capsule Take 30 mg by mouth daily.    famotidine  (PEPCID ) 20 MG tablet Take 1 tablet (20 mg total) by mouth at bedtime. (Patient taking differently: Take 20 mg  by mouth daily as needed.)   fluticasone (FLONASE) 50 MCG/ACT nasal spray Place 2 sprays into both nostrils as needed.    glucose blood (ACCU-CHEK GUIDE TEST) test strip Use to monitor glucose once daily as instructed   loperamide (IMODIUM A-D) 2 MG tablet Take 2 mg by mouth as needed for diarrhea or loose stools.   metFORMIN  (GLUCOPHAGE -XR) 500 MG 24 hr tablet TAKE 1 TABLET BY MOUTH TWICE DAILY WITH MEALS   Multiple Vitamin (MULTIVITAMIN) tablet Take by mouth daily. Nutrafol 2-4 tablets daily (Patient not taking: Reported on 06/02/2024)   pantoprazole  (PROTONIX ) 40 MG tablet TAKE 1 TABLET BY MOUTH EVERY DAY   rosuvastatin  (CRESTOR ) 20 MG tablet Take 1 tablet (20 mg total) by mouth at bedtime.   No facility-administered encounter medications on file as of 06/02/2024.    ALLERGIES: Allergies  Allergen Reactions   Erythromycin Anaphylaxis    Patient allergic to all of the mycin medications    VACCINATION STATUS:  There is no immunization history on file for this patient.  Diabetes She presents for  her follow-up diabetic visit. She has type 2 diabetes mellitus. Onset time: She was diagnosed at approximate age of 52 years. Her disease course has been stable. There are no hypoglycemic associated symptoms. Pertinent negatives for hypoglycemia include no confusion, headaches, pallor or seizures. There are no diabetic associated symptoms. Pertinent negatives for diabetes include no chest pain, no polydipsia, no polyphagia and no polyuria. There are no hypoglycemic complications. Symptoms are stable. There are no diabetic complications. Risk factors for coronary artery disease include diabetes mellitus, dyslipidemia and post-menopausal. Current diabetic treatments: Metformin  500 mg p.o. twice daily. Her weight is fluctuating minimally. She has not had a previous visit with a dietitian. Her home blood glucose trend is fluctuating minimally. (She presents with stable glycemic profile with point-of-care A1c stable at 7%.  She has no reported hypoglycemia.    She continues to metformin  500 mg p.o. twice daily. ) An ACE inhibitor/angiotensin II receptor blocker is not being taken. Eye exam is current.  Hyperlipidemia This is a chronic problem. The current episode started more than 1 year ago. The problem is uncontrolled. Exacerbating diseases include diabetes. Pertinent negatives include no chest pain, myalgias or shortness of breath. Current antihyperlipidemic treatment includes statins (She is consistent taking Crestor  20 mg p.o. nightly.). Risk factors for coronary artery disease include dyslipidemia and diabetes mellitus.    Objective:    BP 112/78   Pulse 84   Ht 5' 9 (1.753 m)   Wt 160 lb 9.6 oz (72.8 kg)   BMI 23.72 kg/m   Wt Readings from Last 3 Encounters:  06/02/24 160 lb 9.6 oz (72.8 kg)  11/24/23 158 lb 6.4 oz (71.8 kg)  07/16/23 155 lb 12.8 oz (70.7 kg)       CMP ( most recent) CMP     Component Value Date/Time   NA 143 05/26/2024 1003   K 4.0 05/26/2024 1003   CL 104 05/26/2024  1003   CO2 22 05/26/2024 1003   GLUCOSE 128 (H) 05/26/2024 1003   GLUCOSE 105 (H) 03/15/2012 1715   BUN 13 05/26/2024 1003   CREATININE 0.68 05/26/2024 1003   CALCIUM  10.9 (H) 05/26/2024 1003   PROT 6.6 05/26/2024 1003   ALBUMIN 4.5 05/26/2024 1003   AST 23 05/26/2024 1003   ALT 28 05/26/2024 1003   ALKPHOS 144 (H) 05/26/2024 1003   BILITOT 0.3 05/26/2024 1003   GFRNONAA 81 04/05/2020 0000   GFRAA 93  04/05/2020 0000    Diabetic Labs (most recent): Lab Results  Component Value Date   HGBA1C 7.0 06/02/2024   HGBA1C 6.6 04/17/2023   HGBA1C 6.5 10/08/2022    Lab Results  Component Value Date   TSH 2.72 11/05/2023   TSH 3.17 05/23/2022   TSH 3.980 02/15/2022   TSH 0.47 04/18/2021   TSH 1.30 10/27/2020   TSH 2.88 04/05/2020   TSH 1.85 01/06/2019   FREET4 1.05 02/15/2022    Lipid Panel     Component Value Date/Time   CHOL 178 05/26/2024 1003   TRIG 114 05/26/2024 1003   HDL 55 05/26/2024 1003   CHOLHDL 3.2 05/26/2024 1003   LDLCALC 103 (H) 05/26/2024 1003   LABVLDL 20 05/26/2024 1003     Assessment & Plan:   1. Uncontrolled type 2 diabetes mellitus with hyperglycemia (HCC)  - Carolyn Porter has currently uncontrolled symptomatic type 2 DM since  58 years of age.  She presents with stable glycemic profile with point-of-care A1c stable at 7%.  She has no reported hypoglycemia.    She continues to metformin  500 mg p.o. twice daily.  - Recent labs reviewed. - I had a long discussion with her about the progressive nature of diabetes and the pathology behind its complications. -She does not report any complications, however,  she remains at a high risk for acute and chronic complications which include CAD, CVA, CKD, retinopathy, and neuropathy. These are all discussed in detail with her.  - she acknowledges that there is a room for improvement in her food and drink choices. - Suggestion is made for her to avoid simple carbohydrates  from her diet including Cakes, Sweet  Desserts, Ice Cream, Soda (diet and regular), Sweet Tea, Candies, Chips, Cookies, Store Bought Juices, Alcohol , Artificial Sweeteners,  Coffee Creamer, and Sugar-free Products, Lemonade. This will help patient to have more stable blood glucose profile and potentially avoid unintended weight gain.   - I have approached her with the following individualized plan to manage  her diabetes and patient agrees:  -She needed additional intervention to lower her A1c towards 6.5% and  was approached with low-dose SGLT2 inhibitors, or GLP-1 receptor agonist, however she would like to postpone intervention with these medications for now.  She plans to intensify her exercise program instead.  This is considered acceptable while staying on metformin  500 mg p.o. twice daily.    - Specific targets for  A1c;  LDL, HDL, Triglycerides, were discussed with the patient.  2) Blood Pressure /Hypertension:  -Her blood pressure is controlled to target.  She is not on antihypertensive medications currently.  3) Lipids/Hyperlipidemia: Her previsit lipid panel showed stable LDL at 103.   She is advised to continue Crestor  20 mg p.o. nightly.   The above detailed whole food plant-based diet will also help with dyslipidemia.   4)  Weight/Diet:  Body mass index is 23.72 kg/m.  -She has achieved 30 pounds of weight loss since last year.  she is not a candidate for major  weight loss.  5-10 pounds of weight loss will help her metabolic profile.  She is advised on exercise, and detailed carbohydrates information provided  -  detailed on discharge instructions.  5) hypercalcemia: Her previsit labs continue to show hypercalcemia at this time observed to be PTH dependent.  She will need further workup to rule out primary hyperparathyroidism.  I discussed and  ordered  24-hour urine calcium  measurement. She will also get bone density DEXA study  at her OB/GYN providers. If her presentation confirms primary hyperparathyroidism, she  may need surgical intervention.  6) vitamin D  deficiency: She is encouraged to resume and continue vitamin D3 2000 units daily.  The prevailing hypercalcemia is now unlikely to be secondary to vitamin D  toxicity.   - she is  advised to maintain close follow up with Liliane Collar, FNP for primary care needs, as well as her other providers for optimal and coordinated care.   I spent  25  minutes in the care of the patient today including review of labs from Thyroid Function, CMP, and other relevant labs ; imaging/biopsy records (current and previous including abstractions from other facilities); face-to-face time discussing  her lab results and symptoms, medications doses, her options of short and long term treatment based on the latest standards of care / guidelines;   and documenting the encounter.  Carolyn Porter  participated in the discussions, expressed understanding, and voiced agreement with the above plans.  All questions were answered to her satisfaction. she is encouraged to contact clinic should she have any questions or concerns prior to her return visit.    Follow up plan: - Return in about 3 months (around 08/31/2024) for 24 Hr Urine Ca & Cr, A1c -NV.  Ranny Earl, MD [facility] [address] [place], [place] [address] Phone: [phone]  Fax: [phone]    06/02/2024, 11:13 AM  This note was partially dictated with voice recognition software. Similar sounding words can be transcribed inadequately or may not  be corrected upon review.

## 2024-06-03 ENCOUNTER — Other Ambulatory Visit: Payer: Self-pay | Admitting: "Endocrinology

## 2024-06-03 ENCOUNTER — Telehealth: Payer: Self-pay | Admitting: "Endocrinology

## 2024-06-03 NOTE — Telephone Encounter (Signed)
 Faxed Dexa Order to [facility] at [phone]

## 2024-06-03 NOTE — Telephone Encounter (Signed)
 Pt needs new order for bone density  sent to [facility] in [place], [place] on [address] [place], [place], [place] [address], Pt states this place had an earlier appt for her bone density that was originally scheduled for March, but office needs order sent to them today to hold her spot for Jan 30,2026.  Phone Number-[phone]

## 2024-06-08 LAB — CREATININE, URINE, 24 HOUR
Creatinine, 24H Ur: 1147 mg/(24.h) (ref 800–1800)
Creatinine, Urine: 44.1 mg/dL

## 2024-06-08 LAB — CALCIUM, URINE, 24 HOUR
Calcium, 24H Urine: 328 mg/(24.h) — ABNORMAL HIGH (ref 0–320)
Calcium, Urine: 12.6 mg/dL

## 2024-06-12 ENCOUNTER — Other Ambulatory Visit: Payer: Self-pay | Admitting: "Endocrinology

## 2024-06-22 ENCOUNTER — Telehealth: Payer: Self-pay

## 2024-06-22 NOTE — Telephone Encounter (Signed)
 Patient calling wanting to know the results of her 24-hr Urine collection and next steps as far as treatment.

## 2024-06-23 NOTE — Telephone Encounter (Signed)
 Patient stated she is scheduling an appointment to see [facility] imaging in an Orthopedic office in [place] July 14, 2024. Patient stated she had another appointment with [place] imaging however it was in March of 2026 and wanted one sooner. Patient will have the imaging form [facility] fax over the results and she would like a call same week to discuss options prior to her appt.

## 2024-07-14 ENCOUNTER — Other Ambulatory Visit: Payer: Self-pay | Admitting: "Endocrinology

## 2024-07-14 NOTE — Telephone Encounter (Signed)
 Sent DEXA to media

## 2024-07-15 NOTE — Telephone Encounter (Signed)
 Patient was called and notified that she was referred to see Dr. Eletha. Patient verbalized understanding and will keep appt with us  March 2026 until otherwise notified to cancel.

## 2024-07-27 ENCOUNTER — Other Ambulatory Visit: Payer: Self-pay | Admitting: "Endocrinology

## 2024-07-27 DIAGNOSIS — E212 Other hyperparathyroidism: Secondary | ICD-10-CM

## 2024-08-16 ENCOUNTER — Ambulatory Visit: Admitting: Gastroenterology

## 2024-09-06 ENCOUNTER — Ambulatory Visit: Admitting: "Endocrinology
